# Patient Record
Sex: Female | Born: 1975 | Race: Black or African American | Hispanic: No | Marital: Married | Smoking: Never smoker
Health system: Southern US, Community
[De-identification: ages and names within clinical notes are randomized; demographics above are authoritative.]

## PROBLEM LIST (undated history)

## (undated) DIAGNOSIS — B019 Varicella without complication: Secondary | ICD-10-CM

## (undated) DIAGNOSIS — D649 Anemia, unspecified: Secondary | ICD-10-CM

## (undated) DIAGNOSIS — N939 Abnormal uterine and vaginal bleeding, unspecified: Secondary | ICD-10-CM

## (undated) DIAGNOSIS — D759 Disease of blood and blood-forming organs, unspecified: Secondary | ICD-10-CM

## (undated) DIAGNOSIS — K219 Gastro-esophageal reflux disease without esophagitis: Secondary | ICD-10-CM

## (undated) DIAGNOSIS — D563 Thalassemia minor: Secondary | ICD-10-CM

## (undated) DIAGNOSIS — R51 Headache: Secondary | ICD-10-CM

## (undated) DIAGNOSIS — D259 Leiomyoma of uterus, unspecified: Secondary | ICD-10-CM

## (undated) DIAGNOSIS — T7840XA Allergy, unspecified, initial encounter: Secondary | ICD-10-CM

## (undated) DIAGNOSIS — R519 Headache, unspecified: Secondary | ICD-10-CM

## (undated) HISTORY — DX: Gastro-esophageal reflux disease without esophagitis: K21.9

## (undated) HISTORY — PX: WISDOM TOOTH EXTRACTION: SHX21

## (undated) HISTORY — DX: Varicella without complication: B01.9

## (undated) HISTORY — DX: Thalassemia minor: D56.3

## (undated) HISTORY — DX: Allergy, unspecified, initial encounter: T78.40XA

## (undated) HISTORY — DX: Leiomyoma of uterus, unspecified: D25.9

---

## 2014-07-09 ENCOUNTER — Ambulatory Visit: Payer: Self-pay | Admitting: Medical

## 2014-07-09 LAB — RAPID STREP-A WITH REFLX: Micro Text Report: POSITIVE

## 2015-02-13 ENCOUNTER — Ambulatory Visit (INDEPENDENT_AMBULATORY_CARE_PROVIDER_SITE_OTHER): Payer: 59 | Admitting: Internal Medicine

## 2015-02-13 ENCOUNTER — Encounter: Payer: Self-pay | Admitting: Internal Medicine

## 2015-02-13 VITALS — BP 116/72 | HR 95 | Temp 98.3°F | Ht 65.5 in | Wt 236.0 lb

## 2015-02-13 DIAGNOSIS — E669 Obesity, unspecified: Secondary | ICD-10-CM | POA: Insufficient documentation

## 2015-02-13 DIAGNOSIS — K219 Gastro-esophageal reflux disease without esophagitis: Secondary | ICD-10-CM

## 2015-02-13 DIAGNOSIS — N926 Irregular menstruation, unspecified: Secondary | ICD-10-CM

## 2015-02-13 DIAGNOSIS — Z0000 Encounter for general adult medical examination without abnormal findings: Secondary | ICD-10-CM

## 2015-02-13 DIAGNOSIS — R635 Abnormal weight gain: Secondary | ICD-10-CM

## 2015-02-13 LAB — COMPREHENSIVE METABOLIC PANEL
ALBUMIN: 3.9 g/dL (ref 3.5–5.2)
ALK PHOS: 73 U/L (ref 39–117)
ALT: 8 U/L (ref 0–35)
AST: 14 U/L (ref 0–37)
BILIRUBIN TOTAL: 0.2 mg/dL (ref 0.2–1.2)
BUN: 12 mg/dL (ref 6–23)
CO2: 26 meq/L (ref 19–32)
Calcium: 8.8 mg/dL (ref 8.4–10.5)
Chloride: 106 mEq/L (ref 96–112)
Creatinine, Ser: 0.74 mg/dL (ref 0.40–1.20)
GFR: 111.43 mL/min (ref >60.00)
GLUCOSE: 95 mg/dL (ref 70–99)
Potassium: 3.8 mEq/L (ref 3.5–5.1)
Sodium: 135 mEq/L (ref 135–145)
Total Protein: 7.5 g/dL (ref 6.0–8.3)

## 2015-02-13 LAB — CBC
HCT: 27.4 % — ABNORMAL LOW (ref 36.0–46.0)
MCHC: 30.6 g/dL (ref 30.0–36.0)
MCV: 60 fl — ABNORMAL LOW (ref 78.0–100.0)
Platelets: 410 10*3/uL — ABNORMAL HIGH (ref 150.0–400.0)
RBC: 4.57 Mil/uL (ref 3.87–5.11)
RDW: 19.5 % — AB (ref 11.5–15.5)
WBC: 6.6 10*3/uL (ref 4.0–10.5)

## 2015-02-13 LAB — LIPID PANEL
CHOLESTEROL: 145 mg/dL (ref 0–200)
HDL: 57.8 mg/dL (ref >39.00)
LDL Cholesterol: 77 mg/dL (ref 0–99)
NonHDL: 87.2
Total CHOL/HDL Ratio: 3
Triglycerides: 51 mg/dL (ref 0.0–149.0)
VLDL: 10.2 mg/dL (ref 0.0–40.0)

## 2015-02-13 LAB — LUTEINIZING HORMONE: LH: 7.23 m[IU]/mL

## 2015-02-13 LAB — FOLLICLE STIMULATING HORMONE: FSH: 8.1 m[IU]/mL

## 2015-02-13 LAB — HEMOGLOBIN A1C: HEMOGLOBIN A1C: 5.9 % (ref 4.6–6.5)

## 2015-02-13 LAB — TSH: TSH: 1.89 u[IU]/mL (ref 0.35–4.50)

## 2015-02-13 MED ORDER — OMEPRAZOLE 20 MG PO CPDR: 20.0000 mg | DELAYED_RELEASE_CAPSULE | Freq: Every day | ORAL | Status: DC

## 2015-02-13 NOTE — Patient Instructions (Signed)

## 2015-02-13 NOTE — Assessment & Plan Note (Signed)
?   If weight gain is due to perimenopause, thyroid abnormality or lack of activity/poor diet Will check TSH, LH/FSH and A1C today-will call her with the lab results Encouraged her to work on diet and exercise for now

## 2015-02-13 NOTE — Assessment & Plan Note (Signed)
?   If this is perimenopause or d/t fibroids Will check LH/FSH today If labs normal, she should have ultrasound to assess fibroids Referral placed to gyn for further evaluation and management

## 2015-02-13 NOTE — Progress Notes (Signed)
HPI  Pt presents to the clinic today to establish care and for management of the conditions listed below: She also request to have her physical exam today.  Flu: 09/2014 Tetanus: > 10 years ago Pap Smear: 2011- normal Mammogram: never Vision Screening: yearly Dentist: yearly  GERD: Occuring > 3 x per week. Acidic foods seem to make it worse. She has tried Zantac OTC without much relief. She has never been prescribed medication for GERD in the past. She has never had an EGD.  She also reports that she has been having irrgular menses. This has been going on for the last 6 months. She reports her periods occur every 2 weeks and last for 9 days at a time. She denies heavy cramping or PMS symptoms. She has noticed some weight gain. She does have a history of fibroids. She has not been sexually active in the last 6 months because her husband suffers from ED. She would like further evaluation today.  Past Medical History  Diagnosis Date  . Chicken pox   . GERD (gastroesophageal reflux disease)     Current Outpatient Prescriptions  Medication Sig Dispense Refill  . calcium carbonate (TUMS - DOSED IN MG ELEMENTAL CALCIUM) 500 MG chewable tablet Chew 1 tablet by mouth daily.    . cetirizine (ZYRTEC) 10 MG tablet Take 10 mg by mouth daily.    Marland Kitchen omeprazole (PRILOSEC) 20 MG capsule Take 1 capsule (20 mg total) by mouth daily. 30 capsule 5   No current facility-administered medications for this visit.    No Known Allergies  Family History  Problem Relation Age of Onset  . Arthritis Mother   . Arthritis Maternal Aunt   . Cancer Maternal Aunt     Breast  . Arthritis Maternal Uncle   . Arthritis Maternal Grandmother   . Stroke Maternal Grandmother   . Cancer Paternal Grandfather     Prostate    History   Social History  . Marital Status: Married    Spouse Name: N/A  . Number of Children: N/A  . Years of Education: N/A   Occupational History  . Not on file.   Social History Main  Topics  . Smoking status: Never Smoker   . Smokeless tobacco: Never Used  . Alcohol Use: 0.0 oz/week    0 Standard drinks or equivalent per week     Comment: occasional  . Drug Use: Not on file  . Sexual Activity: Not on file   Other Topics Concern  . Not on file   Social History Narrative  . No narrative on file    ROS:  Constitutional: Pt reports weight gain. Denies fever, malaise, fatigue, headache.  HEENT: Denies eye pain, eye redness, ear pain, ringing in the ears, wax buildup, runny nose, nasal congestion, bloody nose, or sore throat. Respiratory: Denies difficulty breathing, shortness of breath, cough or sputum production.   Cardiovascular: Denies chest pain, chest tightness, palpitations or swelling in the hands or feet.  Gastrointestinal: Denies abdominal pain, bloating, constipation, diarrhea or blood in the stool.  GU: Pt reports irregular menses. Denies frequency, urgency, pain with urination, blood in urine, odor or discharge. Musculoskeletal: Denies decrease in range of motion, difficulty with gait, muscle pain or joint pain and swelling.  Skin: Denies redness, rashes, lesions or ulcercations.  Neurological: Denies dizziness, difficulty with memory, difficulty with speech or problems with balance and coordination.  Psych: Pt denies anxiety, depression, SI/HI.   No other specific complaints in a complete review of  systems (except as listed in HPI above).  PE:  BP 116/72 mmHg  Pulse 95  Temp(Src) 98.3 F (36.8 C) (Oral)  Ht 5' 5.5" (1.664 m)  Wt 236 lb (107.049 kg)  BMI 38.66 kg/m2  SpO2 98%  LMP 02/01/2015 Wt Readings from Last 3 Encounters:  02/13/15 236 lb (107.049 kg)    General: Appears her stated age, obese in NAD. HEENT: Head: normal shape and size; Eyes: sclera white, no icterus, conjunctiva pink, PERRLA and EOMs intact; Ears: Tm's gray and intact, normal light reflex, cerumen impaction on the left; Nose: mucosa pink and moist, septum midline;  Throat/Mouth: Teeth present, mucosa pink and moist, no lesions or ulcerations noted.  Neck: Neck supple, trachea midline. No masses, lumps or thyromegaly present.  Cardiovascular: Normal rate and rhythm. S1,S2 noted.  No murmur, rubs or gallops noted. No JVD. Trace BLE edema. No carotid bruits noted. Pulmonary/Chest: Normal effort and positive vesicular breath sounds. No respiratory distress. No wheezes, rales or ronchi noted.  Abdomen: Soft and nontender. Normal bowel sounds, no bruits noted. No distention or masses noted. Liver, spleen and kidneys non palpable. Musculoskeletal: Normal range of motion. Strength 5/5 BUE/BLE. No difficulty with gait.  Neurological: Alert and oriented. Cranial nerves II-XII grossly intact. Coordination normal.  Psychiatric: Mood and affect normal. Behavior is normal. Judgment and thought content normal.    Assessment and Plan:  Preventative Health Maintenance:  Will refer to gyn for pap smear, given her irregular menses and fibroids Mammogram ordered today- she will call [facility] to schedule Will check CBC, CMET, Lipid and A1C today  RTC in 1 year or sooner if needed

## 2015-02-13 NOTE — Progress Notes (Signed)
Pre visit review using our clinic review tool, if applicable. No additional management support is needed unless otherwise documented below in the visit note. 

## 2015-02-13 NOTE — Assessment & Plan Note (Signed)
Will start with RX for Prilosec 20 mg daily Discussed benefits of weight loss Advised her to avoid acidic foods, or foods she knows triggers her reflux Will check CBC and CMET today

## 2015-02-15 ENCOUNTER — Encounter: Payer: Self-pay | Admitting: Internal Medicine

## 2015-02-20 ENCOUNTER — Encounter: Payer: Self-pay | Admitting: Internal Medicine

## 2015-02-20 ENCOUNTER — Ambulatory Visit: Payer: Self-pay | Admitting: Internal Medicine

## 2015-02-22 ENCOUNTER — Ambulatory Visit: Payer: Self-pay | Admitting: Internal Medicine

## 2015-02-22 ENCOUNTER — Encounter: Payer: Self-pay | Admitting: Internal Medicine

## 2015-07-25 ENCOUNTER — Encounter (HOSPITAL_COMMUNITY): Payer: Self-pay

## 2015-07-25 ENCOUNTER — Encounter (HOSPITAL_COMMUNITY)
Admission: RE | Admit: 2015-07-25 | Discharge: 2015-07-25 | Disposition: A | Discharge status: Home / Self Care | Payer: 59 | Source: Ambulatory Visit | Attending: Obstetrics & Gynecology | Admitting: Obstetrics & Gynecology

## 2015-07-25 DIAGNOSIS — D649 Anemia, unspecified: Secondary | ICD-10-CM | POA: Insufficient documentation

## 2015-07-25 DIAGNOSIS — Z01818 Encounter for other preprocedural examination: Secondary | ICD-10-CM | POA: Diagnosis present

## 2015-07-25 DIAGNOSIS — D259 Leiomyoma of uterus, unspecified: Secondary | ICD-10-CM | POA: Insufficient documentation

## 2015-07-25 DIAGNOSIS — N920 Excessive and frequent menstruation with regular cycle: Secondary | ICD-10-CM | POA: Diagnosis not present

## 2015-07-25 HISTORY — DX: Disease of blood and blood-forming organs, unspecified: D75.9

## 2015-07-25 HISTORY — DX: Headache: R51

## 2015-07-25 HISTORY — DX: Headache, unspecified: R51.9

## 2015-07-25 HISTORY — DX: Anemia, unspecified: D64.9

## 2015-07-25 HISTORY — DX: Abnormal uterine and vaginal bleeding, unspecified: N93.9

## 2015-07-25 LAB — COMPREHENSIVE METABOLIC PANEL
ALT: 10 U/L — ABNORMAL LOW (ref 14–54)
ANION GAP: 7 (ref 5–15)
AST: 16 U/L (ref 15–41)
Albumin: 3.6 g/dL (ref 3.5–5.0)
Alkaline Phosphatase: 50 U/L (ref 38–126)
BUN: 11 mg/dL (ref 6–20)
CALCIUM: 9 mg/dL (ref 8.9–10.3)
CO2: 24 mmol/L (ref 22–32)
Chloride: 106 mmol/L (ref 101–111)
Creatinine, Ser: 0.76 mg/dL (ref 0.44–1.00)
GFR calc Af Amer: >60 mL/min (ref >60)
GFR calc non Af Amer: >60 mL/min (ref >60)
Glucose, Bld: 112 mg/dL — ABNORMAL HIGH (ref 65–99)
POTASSIUM: 3.9 mmol/L (ref 3.5–5.1)
Sodium: 137 mmol/L (ref 135–145)
Total Bilirubin: 0.6 mg/dL (ref 0.3–1.2)
Total Protein: 7.4 g/dL (ref 6.5–8.1)

## 2015-07-25 LAB — TYPE AND SCREEN
ABO/RH(D): A POS
Antibody Screen: NEGATIVE

## 2015-07-25 LAB — CBC
HEMATOCRIT: 33.7 % — AB (ref 36.0–46.0)
Hemoglobin: 10.9 g/dL — ABNORMAL LOW (ref 12.0–15.0)
MCH: 22 pg — AB (ref 26.0–34.0)
MCHC: 32.3 g/dL (ref 30.0–36.0)
MCV: 68.1 fL — ABNORMAL LOW (ref 78.0–100.0)
Platelets: 303 10*3/uL (ref 150–400)
RBC: 4.95 MIL/uL (ref 3.87–5.11)
RDW: 18 % — AB (ref 11.5–15.5)
WBC: 7.9 10*3/uL (ref 4.0–10.5)

## 2015-07-25 LAB — ABO/RH: ABO/RH(D): A POS

## 2015-07-25 NOTE — Patient Instructions (Signed)
Your procedure is scheduled on:07/30/15  Enter through the Main Entrance at :6am Pick up desk phone and dial [phone] and inform us of your arrival.  Please call [phone] if you have any problems the morning of surgery.  Remember: Do not eat food or drink liquids, including water, after midnight:Sunday   You may brush your teeth the morning of surgery.  Take these meds the morning of surgery with a sip of water:Prilosec, BCP  DO NOT wear jewelry, eye make-up, lipstick,body lotion, or dark fingernail polish.  (Polished toes are ok) You may wear deodorant.  If you are to be admitted after surgery, leave suitcase in car until your room has been assigned. Patients discharged on the day of surgery will not be allowed to drive home. Wear loose fitting, comfortable clothes for your ride home.

## 2015-07-28 NOTE — H&P (Signed)
Latasha Klein is an 39 y.o. female with known history of uterine leiomyomata; heavy menstrual bleeding to anemia and dysmenorrhea.  The patient has tried OCPs but with minimal improvement.  She has completed childbearing.  Patient declines continued hormonal management as well as [place].  Last ultrasound showed 400cc uterus with multiple fibroids; the largest anterior and measuring 4.5 cm.  No prior abdominal surgeries.  Pertinent Gynecological History: Menses: regular every month without intermenstrual spotting Bleeding: dysfunctional uterine bleeding Contraception: OCP (estrogen/progesterone) DES exposure: unknown Blood transfusions: none Sexually transmitted diseases: no past history Previous GYN Procedures: none  Last mammogram: normal Date: 2016 Last pap: normal Date: 2016 OB History: G1, P1   Menstrual History: Menarche age: n/a No LMP recorded.    Past Medical History  Diagnosis Date  . Chicken pox   . GERD (gastroesophageal reflux disease)   . Abnormal uterine bleeding (AUB)   . Headache     occasional migraines  . Blood dyscrasia     beta thalacemia  . Anemia     Past Surgical History  Procedure Laterality Date  . Wisdom tooth extraction      Family History  Problem Relation Age of Onset  . Arthritis Mother   . Arthritis Maternal Aunt   . Cancer Maternal Aunt     Breast  . Arthritis Maternal Uncle   . Arthritis Maternal Grandmother   . Stroke Maternal Grandmother   . Cancer Paternal Grandfather     Prostate    Social History:  reports that she has never smoked. She has never used smokeless tobacco. She reports that she drinks alcohol. She reports that she does not use illicit drugs.  Allergies: No Known Allergies  No prescriptions prior to admission    ROS  There were no vitals taken for this visit. Physical Exam  Constitutional: She is oriented to person, place, and time. She appears well-developed and well-nourished.  GI: Soft. There is no  rebound and no guarding.  Neurological: She is alert and oriented to person, place, and time.  Skin: Skin is warm and dry.  Psychiatric: She has a normal mood and affect. Her behavior is normal.    No results found for this or any previous visit (from the past 24 hour(s)).  No results found.  Assessment/Plan: 39 yo G1P1 with symptomatic uterine leiomyomata -Patient is counseled about risk of bleeding, infection, scarring and damage to surrounding structures.  Patient is counseled re: possible need to convert to abdominal hysterectomy secondary to size and number of fibroids.  All questions were answered and we will proceed with LAVH, bilateral salpingectomy, possible TAH.  Salinda Snedeker, [place] 07/28/2015, 2:47 PM

## 2015-07-29 ENCOUNTER — Encounter (HOSPITAL_COMMUNITY): Payer: Self-pay | Admitting: Anesthesiology

## 2015-07-29 NOTE — Anesthesia Preprocedure Evaluation (Addendum)
Anesthesia Evaluation  Patient identified by MRN, date of birth, ID band Patient awake    Reviewed: Allergy & Precautions, NPO status , Patient's Chart, lab work & pertinent test results  Airway Mallampati: II  TM Distance: >3 FB Neck ROM: Full    Dental no notable dental hx. (+) Chipped,    Pulmonary neg pulmonary ROS,  breath sounds clear to auscultation  Pulmonary exam normal       Cardiovascular Normal cardiovascular examRhythm:Regular Rate:Normal     Neuro/Psych  Headaches, negative psych ROS   GI/Hepatic Neg liver ROS, GERD-  Medicated and Controlled,  Endo/Other  Obesity  Renal/GU negative Renal ROS  negative genitourinary   Musculoskeletal   Abdominal   Peds  Hematology  (+) Blood dyscrasia, anemia , Beta thalassemia   Anesthesia Other Findings   Reproductive/Obstetrics                            Anesthesia Physical Anesthesia Plan  ASA: II  Anesthesia Plan: General   Post-op Pain Management:    Induction: Intravenous and Cricoid pressure planned  Airway Management Planned: Oral ETT  Additional Equipment:   Intra-op Plan:   Post-operative Plan:   Informed Consent: I have reviewed the patients History and Physical, chart, labs and discussed the procedure including the risks, benefits and alternatives for the proposed anesthesia with the patient or authorized representative who has indicated his/her understanding and acceptance.   Dental advisory given  Plan Discussed with: Anesthesiologist, CRNA and Surgeon  Anesthesia Plan Comments:         Anesthesia Quick Evaluation

## 2015-07-30 ENCOUNTER — Ambulatory Visit (HOSPITAL_COMMUNITY): Payer: 59 | Admitting: Anesthesiology

## 2015-07-30 ENCOUNTER — Inpatient Hospital Stay (HOSPITAL_COMMUNITY)
Admission: AD | Admit: 2015-07-30 | Discharge: 2015-08-01 | DRG: 743 | Disposition: A | Discharge status: Home / Self Care | Payer: 59 | Source: Ambulatory Visit | Attending: Obstetrics & Gynecology | Admitting: Obstetrics & Gynecology

## 2015-07-30 ENCOUNTER — Encounter (HOSPITAL_COMMUNITY): Payer: Self-pay | Admitting: *Deleted

## 2015-07-30 ENCOUNTER — Encounter (HOSPITAL_COMMUNITY)
Admission: AD | Disposition: A | Discharge status: Home / Self Care | Payer: Self-pay | Source: Ambulatory Visit | Attending: Obstetrics & Gynecology

## 2015-07-30 DIAGNOSIS — D259 Leiomyoma of uterus, unspecified: Secondary | ICD-10-CM | POA: Diagnosis present

## 2015-07-30 DIAGNOSIS — D649 Anemia, unspecified: Secondary | ICD-10-CM | POA: Diagnosis present

## 2015-07-30 DIAGNOSIS — N946 Dysmenorrhea, unspecified: Secondary | ICD-10-CM | POA: Diagnosis present

## 2015-07-30 DIAGNOSIS — Z90710 Acquired absence of both cervix and uterus: Secondary | ICD-10-CM | POA: Diagnosis present

## 2015-07-30 DIAGNOSIS — K219 Gastro-esophageal reflux disease without esophagitis: Secondary | ICD-10-CM | POA: Diagnosis present

## 2015-07-30 HISTORY — PX: BILATERAL SALPINGECTOMY: SHX5743

## 2015-07-30 HISTORY — PX: ABDOMINAL HYSTERECTOMY: SHX81

## 2015-07-30 HISTORY — PX: LAPAROSCOPIC ASSISTED VAGINAL HYSTERECTOMY: SHX5398

## 2015-07-30 LAB — PREGNANCY, URINE: Preg Test, Ur: NEGATIVE

## 2015-07-30 SURGERY — HYSTERECTOMY, VAGINAL, LAPAROSCOPY-ASSISTED
Anesthesia: General | Site: Abdomen

## 2015-07-30 MED ORDER — MIDAZOLAM HCL 2 MG/2ML IJ SOLN
INTRAMUSCULAR | Status: AC
  Filled 2015-07-30: qty 4

## 2015-07-30 MED ORDER — CEFAZOLIN SODIUM-DEXTROSE 2-3 GM-% IV SOLR: 2.0000 g | INTRAVENOUS | Status: DC

## 2015-07-30 MED ORDER — LIDOCAINE HCL (CARDIAC) 20 MG/ML IV SOLN
INTRAVENOUS | Status: DC | PRN
  Administered 2015-07-30 (×2): 50 mg via INTRAVENOUS

## 2015-07-30 MED ORDER — HYDROMORPHONE HCL 1 MG/ML IJ SOLN
INTRAMUSCULAR | Status: AC
  Filled 2015-07-30: qty 1

## 2015-07-30 MED ORDER — FENTANYL CITRATE (PF) 100 MCG/2ML IJ SOLN
INTRAMUSCULAR | Status: DC | PRN
  Administered 2015-07-30 (×3): 100 ug via INTRAVENOUS
  Administered 2015-07-30: 50 ug via INTRAVENOUS

## 2015-07-30 MED ORDER — PROPOFOL 10 MG/ML IV BOLUS
INTRAVENOUS | Status: AC
  Filled 2015-07-30: qty 20

## 2015-07-30 MED ORDER — DOCUSATE SODIUM 100 MG PO CAPS
100.0000 mg | ORAL_CAPSULE | Freq: Two times a day (BID) | ORAL | Status: DC
  Administered 2015-07-30 – 2015-07-31 (×3): 100 mg via ORAL
  Filled 2015-07-30 (×7): qty 1

## 2015-07-30 MED ORDER — LACTATED RINGERS IV SOLN
INTRAVENOUS | Status: DC
  Administered 2015-07-30 (×2): via INTRAVENOUS

## 2015-07-30 MED ORDER — KETOROLAC TROMETHAMINE 30 MG/ML IJ SOLN
30.0000 mg | Freq: Four times a day (QID) | INTRAMUSCULAR | Status: DC
  Administered 2015-07-30 – 2015-08-01 (×6): 30 mg via INTRAVENOUS
  Filled 2015-07-30 (×5): qty 1

## 2015-07-30 MED ORDER — DEXTROSE IN LACTATED RINGERS 5 % IV SOLN
INTRAVENOUS | Status: DC
  Administered 2015-07-30 – 2015-07-31 (×3): via INTRAVENOUS

## 2015-07-30 MED ORDER — ONDANSETRON HCL 4 MG PO TABS: 4.0000 mg | ORAL_TABLET | Freq: Four times a day (QID) | ORAL | Status: DC | PRN

## 2015-07-30 MED ORDER — NEOSTIGMINE METHYLSULFATE 10 MG/10ML IV SOLN
INTRAVENOUS | Status: AC
  Filled 2015-07-30: qty 1

## 2015-07-30 MED ORDER — ALUM & MAG HYDROXIDE-SIMETH 200-200-20 MG/5ML PO SUSP
30.0000 mL | ORAL | Status: DC | PRN
  Filled 2015-07-30: qty 30

## 2015-07-30 MED ORDER — HYDROMORPHONE HCL 1 MG/ML IJ SOLN
0.2000 mg | INTRAMUSCULAR | Status: DC | PRN
  Administered 2015-07-30: 0.5 mg via INTRAVENOUS
  Administered 2015-07-30: 0.6 mg via INTRAVENOUS
  Administered 2015-07-30 (×2): 0.5 mg via INTRAVENOUS
  Filled 2015-07-30 (×3): qty 1

## 2015-07-30 MED ORDER — SIMETHICONE 80 MG PO CHEW
80.0000 mg | CHEWABLE_TABLET | Freq: Four times a day (QID) | ORAL | Status: DC | PRN
  Filled 2015-07-30: qty 1

## 2015-07-30 MED ORDER — OXYCODONE-ACETAMINOPHEN 5-325 MG PO TABS
1.0000 | ORAL_TABLET | ORAL | Status: DC | PRN
  Administered 2015-07-31 (×3): 2 via ORAL
  Filled 2015-07-30 (×3): qty 2
  Filled 2015-07-30: qty 1

## 2015-07-30 MED ORDER — ONDANSETRON HCL 4 MG/2ML IJ SOLN
INTRAMUSCULAR | Status: AC
  Filled 2015-07-30: qty 2

## 2015-07-30 MED ORDER — ROCURONIUM BROMIDE 100 MG/10ML IV SOLN
INTRAVENOUS | Status: AC
  Filled 2015-07-30: qty 1

## 2015-07-30 MED ORDER — ONDANSETRON HCL 4 MG/2ML IJ SOLN
INTRAMUSCULAR | Status: DC | PRN
  Administered 2015-07-30: 4 mg via INTRAVENOUS

## 2015-07-30 MED ORDER — HYDROMORPHONE HCL 1 MG/ML IJ SOLN
INTRAMUSCULAR | Status: DC | PRN
Start: 2015-07-30 — End: 2015-07-30
  Administered 2015-07-30 (×2): 0.5 mg via INTRAVENOUS

## 2015-07-30 MED ORDER — BUPIVACAINE HCL (PF) 0.25 % IJ SOLN
INTRAMUSCULAR | Status: DC | PRN
  Administered 2015-07-30: 2 mL

## 2015-07-30 MED ORDER — HYDROMORPHONE HCL 1 MG/ML IJ SOLN
0.2500 mg | INTRAMUSCULAR | Status: DC | PRN
  Administered 2015-07-30 (×3): 0.5 mg via INTRAVENOUS

## 2015-07-30 MED ORDER — MENTHOL 3 MG MT LOZG
1.0000 | LOZENGE | OROMUCOSAL | Status: DC | PRN
  Filled 2015-07-30: qty 9

## 2015-07-30 MED ORDER — KETOROLAC TROMETHAMINE 30 MG/ML IJ SOLN
INTRAMUSCULAR | Status: AC
  Filled 2015-07-30: qty 1

## 2015-07-30 MED ORDER — FENTANYL CITRATE (PF) 100 MCG/2ML IJ SOLN
INTRAMUSCULAR | Status: AC
  Filled 2015-07-30: qty 4

## 2015-07-30 MED ORDER — CEFAZOLIN SODIUM-DEXTROSE 2-3 GM-% IV SOLR
INTRAVENOUS | Status: AC
  Filled 2015-07-30: qty 50

## 2015-07-30 MED ORDER — ROCURONIUM BROMIDE 100 MG/10ML IV SOLN
INTRAVENOUS | Status: DC | PRN
  Administered 2015-07-30: 10 mg via INTRAVENOUS
  Administered 2015-07-30: 45 mg via INTRAVENOUS
  Administered 2015-07-30: 5 mg via INTRAVENOUS
  Administered 2015-07-30 (×2): 10 mg via INTRAVENOUS

## 2015-07-30 MED ORDER — SCOPOLAMINE 1 MG/3DAYS TD PT72
MEDICATED_PATCH | TRANSDERMAL | Status: AC
  Administered 2015-07-30: 1.5 mg via TRANSDERMAL
  Filled 2015-07-30: qty 1

## 2015-07-30 MED ORDER — MIDAZOLAM HCL 5 MG/5ML IJ SOLN
INTRAMUSCULAR | Status: DC | PRN
  Administered 2015-07-30: 2 mg via INTRAVENOUS

## 2015-07-30 MED ORDER — GLYCOPYRROLATE 0.2 MG/ML IJ SOLN
INTRAMUSCULAR | Status: DC | PRN
  Administered 2015-07-30: .6 mg via INTRAVENOUS

## 2015-07-30 MED ORDER — ONDANSETRON HCL 4 MG/2ML IJ SOLN
4.0000 mg | Freq: Four times a day (QID) | INTRAMUSCULAR | Status: DC | PRN
  Administered 2015-07-30: 4 mg via INTRAVENOUS
  Filled 2015-07-30: qty 2

## 2015-07-30 MED ORDER — FENTANYL CITRATE (PF) 250 MCG/5ML IJ SOLN
INTRAMUSCULAR | Status: AC
  Filled 2015-07-30: qty 25

## 2015-07-30 MED ORDER — LACTATED RINGERS IV SOLN
INTRAVENOUS | Status: DC
  Administered 2015-07-30: 07:00:00 via INTRAVENOUS

## 2015-07-30 MED ORDER — HYDROMORPHONE HCL 1 MG/ML IJ SOLN
INTRAMUSCULAR | Status: AC
  Administered 2015-07-30: 0.5 mg via INTRAVENOUS
  Filled 2015-07-30: qty 1

## 2015-07-30 MED ORDER — SCOPOLAMINE 1 MG/3DAYS TD PT72
1.0000 | MEDICATED_PATCH | Freq: Once | TRANSDERMAL | Status: DC
  Administered 2015-07-30: 1.5 mg via TRANSDERMAL

## 2015-07-30 MED ORDER — METOCLOPRAMIDE HCL 5 MG/ML IJ SOLN: 10.0000 mg | Freq: Once | INTRAMUSCULAR | Status: DC | PRN

## 2015-07-30 MED ORDER — MEPERIDINE HCL 25 MG/ML IJ SOLN: 6.2500 mg | INTRAMUSCULAR | Status: DC | PRN

## 2015-07-30 MED ORDER — PROPOFOL 10 MG/ML IV BOLUS
INTRAVENOUS | Status: DC | PRN
  Administered 2015-07-30: 200 mg via INTRAVENOUS

## 2015-07-30 MED ORDER — NEOSTIGMINE METHYLSULFATE 10 MG/10ML IV SOLN
INTRAVENOUS | Status: DC | PRN
  Administered 2015-07-30: 3.5 mg via INTRAVENOUS

## 2015-07-30 MED ORDER — KETOROLAC TROMETHAMINE 30 MG/ML IJ SOLN: 30.0000 mg | Freq: Four times a day (QID) | INTRAMUSCULAR | Status: DC

## 2015-07-30 MED ORDER — LIDOCAINE HCL (CARDIAC) 20 MG/ML IV SOLN
INTRAVENOUS | Status: AC
  Filled 2015-07-30: qty 5

## 2015-07-30 MED ORDER — BUPIVACAINE HCL (PF) 0.25 % IJ SOLN
INTRAMUSCULAR | Status: AC
  Filled 2015-07-30: qty 30

## 2015-07-30 SURGICAL SUPPLY — 44 items
CABLE HIGH FREQUENCY MONO STRZ (ELECTRODE) IMPLANT
CANISTER SUCT 3000ML (MISCELLANEOUS) ×3 IMPLANT
CLOTH BEACON ORANGE TIMEOUT ST (SAFETY) ×3 IMPLANT
COVER BACK TABLE 60X90IN (DRAPES) ×3 IMPLANT
DECANTER SPIKE VIAL GLASS SM (MISCELLANEOUS) ×3 IMPLANT
DRSG COVADERM PLUS 2X2 (GAUZE/BANDAGES/DRESSINGS) IMPLANT
DRSG OPSITE POSTOP 3X4 (GAUZE/BANDAGES/DRESSINGS) ×3 IMPLANT
DRSG OPSITE POSTOP 4X10 (GAUZE/BANDAGES/DRESSINGS) ×3 IMPLANT
DURAPREP 26ML APPLICATOR (WOUND CARE) ×3 IMPLANT
ELECT LIGASURE LONG (ELECTRODE) IMPLANT
ELECT REM PT RETURN 9FT ADLT (ELECTROSURGICAL) ×3
ELECTRODE REM PT RTRN 9FT ADLT (ELECTROSURGICAL) ×2 IMPLANT
FILTER SMOKE EVAC LAPAROSHD (FILTER) ×3 IMPLANT
GLOVE BIO SURGEON STRL SZ 6 (GLOVE) ×6 IMPLANT
GLOVE BIOGEL PI IND STRL 6 (GLOVE) ×4 IMPLANT
GLOVE BIOGEL PI IND STRL 7.0 (GLOVE) ×4 IMPLANT
GLOVE BIOGEL PI INDICATOR 6 (GLOVE) ×2
GLOVE BIOGEL PI INDICATOR 7.0 (GLOVE) ×2
LIQUID BAND (GAUZE/BANDAGES/DRESSINGS) ×3 IMPLANT
NEEDLE INSUFFLATION 120MM (ENDOMECHANICALS) ×3 IMPLANT
NS IRRIG 1000ML POUR BTL (IV SOLUTION) ×6 IMPLANT
PACK LAVH (CUSTOM PROCEDURE TRAY) ×3 IMPLANT
PACK ROBOTIC GOWN (GOWN DISPOSABLE) ×3 IMPLANT
PAD POSITIONING PINK XL (MISCELLANEOUS) ×3 IMPLANT
SEALER TISSUE G2 CVD JAW 45CM (ENDOMECHANICALS) IMPLANT
SLEEVE XCEL OPT CAN 5 100 (ENDOMECHANICALS) ×3 IMPLANT
SPONGE LAP 18X18 X RAY DECT (DISPOSABLE) ×9 IMPLANT
SUT MNCRL 0 MO-4 VIOLET 18 CR (SUTURE) ×6 IMPLANT
SUT MNCRL AB 3-0 PS2 27 (SUTURE) ×3 IMPLANT
SUT MON AB 2-0 CT1 36 (SUTURE) ×3 IMPLANT
SUT MON AB 2-0 SH 27 (SUTURE) ×1
SUT MON AB 2-0 SH27 (SUTURE) ×2 IMPLANT
SUT MONOCRYL 0 MO 4 18  CR/8 (SUTURE) ×3
SUT PLAIN 2 0 XLH (SUTURE) ×3 IMPLANT
SUT VIC AB 0 CT1 27 (SUTURE) ×1
SUT VIC AB 0 CT1 27XBRD ANBCTR (SUTURE) ×2 IMPLANT
SUT VIC AB 4-0 KS 27 (SUTURE) ×3 IMPLANT
SUT VICRYL 0 TIES 12 18 (SUTURE) ×3 IMPLANT
SUT VICRYL 0 UR6 27IN ABS (SUTURE) ×3 IMPLANT
TOWEL OR 17X24 6PK STRL BLUE (TOWEL DISPOSABLE) ×6 IMPLANT
TRAY FOLEY CATH SILVER 14FR (SET/KITS/TRAYS/PACK) ×3 IMPLANT
TROCAR XCEL NON-BLD 11X100MML (ENDOMECHANICALS) ×3 IMPLANT
TROCAR XCEL NON-BLD 5MMX100MML (ENDOMECHANICALS) ×3 IMPLANT
WARMER LAPAROSCOPE (MISCELLANEOUS) ×3 IMPLANT

## 2015-07-30 NOTE — Transfer of Care (Signed)
Immediate Anesthesia Transfer of Care Note  Patient: [name]  Procedure(s) Performed: Procedure(s): ATTEMPTED LAPAROSCOPIC ASSISTED VAGINAL HYSTERECTOMY (N/A) HYSTERECTOMY ABDOMINAL (N/A) BILATERAL SALPINGECTOMY (Bilateral)  Patient Location: PACU  Anesthesia Type:General  Level of Consciousness: awake, alert  and oriented  Airway & Oxygen Therapy: Patient Spontanous Breathing and Patient connected to nasal cannula oxygen  Post-op Assessment: Report given to RN and Post -op Vital signs reviewed and stable  Post vital signs: Reviewed and stable  Last Vitals:  Filed Vitals:   07/30/15 0615  BP: 139/87  Pulse: 98  Temp: 01.7 C    Complications: No apparent anesthesia complications

## 2015-07-30 NOTE — Anesthesia Postprocedure Evaluation (Signed)
  Anesthesia Post-op Note  Patient: Latasha Klein  Procedure(s) Performed: Procedure(s): ATTEMPTED LAPAROSCOPIC ASSISTED VAGINAL HYSTERECTOMY (N/A) HYSTERECTOMY ABDOMINAL (N/A) BILATERAL SALPINGECTOMY (Bilateral)  Patient Location: PACU  Anesthesia Type:General  Level of Consciousness: awake, alert  and oriented  Airway and Oxygen Therapy: Patient Spontanous Breathing and Patient connected to nasal cannula oxygen  Post-op Pain: mild  Post-op Assessment: Post-op Vital signs reviewed, Patient's Cardiovascular Status Stable, Respiratory Function Stable, Patent Airway, No signs of Nausea or vomiting and Pain level controlled              Post-op Vital Signs: Reviewed and stable  Last Vitals:  Filed Vitals:   07/30/15 1043  BP: 135/72  Pulse: 85  Temp: 36.9 C  Resp: 18    Complications: No apparent anesthesia complications

## 2015-07-30 NOTE — Op Note (Signed)
PROCEDURE DATE: 07/30/2015 PREOPERATIVE DIAGNOSIS: Symptomatic leiomyomata  POSTOPERATIVE DIAGNOSIS: The same  PROCEDURE: Laparoscopic Assisted Vaginal Hysterectomy converted to Totally Abdominal Hysterectomy with Bilateral Salpingectomy SURGEON: Dr. Linda Hedges  ASSISTANT: Dr. Marylynn Pearson  INDICATIONS: 39 y.o. G1P1 with uterine fibroids, heavy menstrual bleeding to anemia and dysmenorrhea desiring definitive surgical management. Risks of surgery were discussed with the patient including but not limited to: bleeding which may require transfusion or reoperation; infection which may require antibiotics; injury to bowel, bladder, ureters or other surrounding organs; need for additional procedures including laparotomy; thromboembolic phenomenon, incisional problems and other postoperative/anesthesia complications. Written informed consent was obtained.  FINDINGS: Enlarged uterus with fibroids (544g), normal adnexa bilaterally.   ANESTHESIA: General  ESTIMATED BLOOD LOSS: 300 ml  SPECIMENS: Uterus, cervix, bilateral fallopian tubes  COMPLICATIONS: None immediate  PROCEDURE IN DETAIL: The patient received intravenous antibiotics and had sequential compression devices applied to her lower extremities while in the preoperative area. She was then taken to the operating room where general anesthesia was administered and was found to be adequate. She was placed in the dorsal lithotomy position, and was prepped and draped in a sterile manner. A Foley catheter was placed. A uterine manipulator was then advanced into the uterus . After an adequate timeout was performed, attention was then turned to the patient's abdomen where a 10-mm skin incision was made in the umbilical fold. The Veress needle was carefully introduced into the peritoneal cavity through the abdominal wall. Intraperitoneal placement was confirmed by drop in intraabdominal pressure with insufflation of carbon dioxide gas. Adequate  pneumoperitoneum was obtained, and the 10/11 XL trocar and sleeve were then advanced without difficulty into the abdomen where intraabdominal placement was confirmed by the laparoscope. A survey of the patient's pelvis revealed an enlarged uterus that filled the pelvis and was immobile on manipulation.  Secondary to the inability to safely perform dissection, the decision was made to convert to open procedure. A Pfannensteil skin incision was made. This incision was taken down to the fascia using electrocautery with care given to maintain good hemostasis. The fascia was incised in the midline and the fascial incision was then extended bilaterally using electrocautery without difficulty. The fascia was then dissected off the underlying rectus muscles using blunt and sharp dissection. The rectus muscles were split bluntly in the midline and the peritoneum entered sharply without complication. This peritoneal incision was then extended superiorly and inferiorly with care given to prevent bowel or bladder injury. The uterus at this point was noted to be mobilized and was elevated from the incision using a tenaculum. The round ligaments on each side were clamped, suture ligated, and transected with electrocautery allowing entry into the broad ligament. The anterior and posterior leaves of the broad ligament were separated and the ureters were inspected to be safely away from the area of dissection bilaterally. A hole was created in the clear portion of the posterior broad ligament. Adnexae were clamped on the patient's right side. This pedicle was then clamped, cut, and doubly suture ligated with good hemostasis. This procedure was repeated in an identical fashion on the left site allowing for both adnexa to remain in place.  A bladder flap was then created across the anterior leaf of the broad ligament and the bladder was then bluntly dissected off the lower uterine segment and cervix with good hemostasis. The uterine  arteries were then skeletonized bilaterally and then clamped, cut, and ligated with care given to prevent ureteral injury. The uterosacral ligaments were then  clamped, cut, and ligated bilaterally. Finally, the cardinal ligaments were clamped, cut, and ligated bilaterally.  Acutely curved clamps were placed across the vagina just under the cervix, and the specimen was amputated and sent to pathology. The vaginal cuff was closed with a series of interrupted 0 Vicryl figure-of-eight sutures with care given to incorporate the anterior pubocervical fascia and the posterior rectovaginal fascia.  The vaginal cuff angles were noted to have good hemostasis.  The right fallopian tube was clamped, excised and the pedicle suture ligated to hemostasis.  The left fallopian tube was treated in the same fashion.  Both tubes were sent for pathology.  The pelvis was irrigated and hemostasis was reconfirmed at all pedicles and along the pelvic sidewall. The peritoneum was closed with 2-0 Monocryl, and the fascia was closed with 0 Vicryl in a running fashion. The subcutaneous layer was reapproximated with 2-0 plain gut. The skin was closed with a 4-0 Vicryl subcuticular stitch. The infraumbilical fascia was closed with 0 Vicryl figure of eight stitch.  The skin was closed with 4-0 Monocryl subcuticular stitch.  Sponge, lap, needle, and instrument counts were correct times two. The patient was taken to the recovery area awake, extubated and in stable condition.

## 2015-07-30 NOTE — Progress Notes (Signed)
No current c/o.  Pain is well controlled with meds.  No N/V.  No CP/SOB.  Tolerating clears.    VSS. AF. UOP adequate and clear  Gen: A&O x 3 Abd: soft, ND, dressings c/d x 2 Ext: no c/c/e, SCDs on  POD#0 s/p TAH -Continue pain and nausea control -Advance diet -AM labs pending -Foley out in AM  [facility], DO

## 2015-07-30 NOTE — Progress Notes (Signed)
No change to H&P.  Labs reviewed.  Patient's questions answered.    Linda Hedges, DO

## 2015-07-31 ENCOUNTER — Encounter (HOSPITAL_COMMUNITY): Payer: Self-pay | Admitting: Obstetrics & Gynecology

## 2015-07-31 LAB — COMPREHENSIVE METABOLIC PANEL
ALBUMIN: 2.7 g/dL — AB (ref 3.5–5.0)
ALK PHOS: 40 U/L (ref 38–126)
ALT: 7 U/L — ABNORMAL LOW (ref 14–54)
ANION GAP: 4 — AB (ref 5–15)
AST: 12 U/L — ABNORMAL LOW (ref 15–41)
BILIRUBIN TOTAL: 0.7 mg/dL (ref 0.3–1.2)
BUN: 5 mg/dL — AB (ref 6–20)
CALCIUM: 8 mg/dL — AB (ref 8.9–10.3)
CO2: 27 mmol/L (ref 22–32)
Chloride: 108 mmol/L (ref 101–111)
Creatinine, Ser: 0.73 mg/dL (ref 0.44–1.00)
GFR calc Af Amer: >60 mL/min (ref >60)
GLUCOSE: 133 mg/dL — AB (ref 65–99)
Potassium: 3.7 mmol/L (ref 3.5–5.1)
Sodium: 139 mmol/L (ref 135–145)
TOTAL PROTEIN: 6 g/dL — AB (ref 6.5–8.1)

## 2015-07-31 LAB — CBC
HEMATOCRIT: 30 % — AB (ref 36.0–46.0)
HEMOGLOBIN: 9.5 g/dL — AB (ref 12.0–15.0)
MCH: 21.9 pg — ABNORMAL LOW (ref 26.0–34.0)
MCHC: 31.7 g/dL (ref 30.0–36.0)
MCV: 69.1 fL — AB (ref 78.0–100.0)
Platelets: 261 10*3/uL (ref 150–400)
RBC: 4.34 MIL/uL (ref 3.87–5.11)
RDW: 17.8 % — AB (ref 11.5–15.5)
WBC: 11.1 10*3/uL — ABNORMAL HIGH (ref 4.0–10.5)

## 2015-07-31 MED ORDER — SODIUM CHLORIDE 0.9 % IJ SOLN: 3.0000 mL | INTRAMUSCULAR | Status: DC | PRN

## 2015-07-31 MED ORDER — SODIUM CHLORIDE 0.9 % IV SOLN: 250.0000 mL | INTRAVENOUS | Status: DC | PRN

## 2015-07-31 MED ORDER — SODIUM CHLORIDE 0.9 % IJ SOLN
3.0000 mL | Freq: Two times a day (BID) | INTRAMUSCULAR | Status: DC
  Administered 2015-07-31: 3 mL via INTRAVENOUS

## 2015-07-31 NOTE — Addendum Note (Signed)
Addendum  created 07/31/15 1118 by Brock Ra, CRNA   Modules edited: Notes Section   Notes Section:  File: [id number]

## 2015-07-31 NOTE — Progress Notes (Signed)
No current c/o.  Good pain control.  No n/v.  Tolerating po.  Foley in.  Ambulated last night without difficulty.    VSS.  AF. Labs stable (Hgb 9.5) UOP adequate and clear  Gen: A&O x 3 Abd: soft, ND, dressings c/d x 2 Ext: no c/c/e  POD#1 s/p TAH -Advance diet -Encourage ambulation -D/C Foley and IVF -Transition to po pain meds -Anticipate D/C tomorrow  Linda Hedges, DO

## 2015-07-31 NOTE — Anesthesia Postprocedure Evaluation (Signed)
  Anesthesia Post-op Note  Patient: Latasha Klein  Procedure(s) Performed: Procedure(s): ATTEMPTED LAPAROSCOPIC ASSISTED VAGINAL HYSTERECTOMY (N/A) HYSTERECTOMY ABDOMINAL (N/A) BILATERAL SALPINGECTOMY (Bilateral)  Patient Location: [facility]  Anesthesia Type:General  Level of Consciousness: awake, alert , oriented and patient cooperative  Airway and Oxygen Therapy: Patient Spontanous Breathing  Post-op Pain: mild  Post-op Assessment: Post-op Vital signs reviewed, Patient's Cardiovascular Status Stable, Respiratory Function Stable, Patent Airway, No signs of Nausea or vomiting, Adequate PO intake, Pain level controlled, No headache, No backache and Patient able to bend at knees              Post-op Vital Signs: Reviewed and stable  Last Vitals:  Filed Vitals:   07/31/15 1037  BP: 121/65  Pulse: 79  Temp: 36.6 C  Resp: 18    Complications: No apparent anesthesia complications

## 2015-08-01 MED ORDER — DOCUSATE SODIUM 100 MG PO CAPS: 100.0000 mg | ORAL_CAPSULE | Freq: Two times a day (BID) | ORAL | Status: DC

## 2015-08-01 MED ORDER — OXYCODONE-ACETAMINOPHEN 5-325 MG PO TABS: 1.0000 | ORAL_TABLET | ORAL | Status: DC | PRN

## 2015-08-01 MED ORDER — IBUPROFEN 800 MG PO TABS: 800.0000 mg | ORAL_TABLET | Freq: Four times a day (QID) | ORAL | Status: DC | PRN

## 2015-08-01 NOTE — Progress Notes (Signed)
Pt is discharged in the care of husband. No equipment needed for home use Stable Abdominal incisions are clean and dry. Understands all discharged instructions well Questions asked and answered. Denies bleeding or pain.

## 2015-08-01 NOTE — Progress Notes (Signed)
No complaints.  Voiding well.  Ambulating well.  Pain well controlled with medication.  Tolerating po.  VSS. AF. Adequate UOP  Gen: A&O x 3 Abd: Soft, ND, dressing c/d x 2 Ext: no c/c/e  POD#2 s/p TAH -Meeting all goals -Discharge home.  Linda Hedges, DO

## 2015-08-01 NOTE — Discharge Summary (Signed)
Physician Discharge Summary  Patient ID: Latasha Klein MRN: [id number] DOB/AGE: [birth year] 39 y.o.  Admit date: 07/30/2015 Discharge date: 08/01/2015  Admission Diagnoses:  Symptomatic fibroid uterus  Discharge Diagnoses: SAA Active Problems:   S/P hysterectomy   Discharged Condition: good  Hospital Course: Patient was admitted for anticipated LAVH, possible TAH to treat symptomatic fibroid uterus.  The laparoscopic procedure was converted to abdominal secondary to large uterus.  The TAH, b/l salpingectomy was performed without complication.  The patient did well postoperatively and on POD#2 was meeting all goals.  She was discharged home with f/u in office in 1-2 weeks.  Consults: None  Significant Diagnostic Studies: labs: Hgb 9.5  Treatments: surgery: Dx l/s, TAH, b/l salpingectomy  Discharge Exam: Blood pressure 114/70, pulse 80, temperature 98.7 F (37.1 C), temperature source Oral, resp. rate 16, height 5\' 6"  (1.676 m), weight 240 lb (108.863 kg), SpO2 100 %. General appearance: alert, cooperative and appears stated age GI: normal findings: appropriately ttp Extremities: extremities normal, atraumatic, no cyanosis or edema Incision/Wound: c/d dressings  Disposition: Final discharge disposition not confirmed     Medication List    STOP taking these medications        ADVIL 200 MG Caps  Generic drug:  Ibuprofen  Replaced by:  ibuprofen 800 MG tablet     MINASTRIN 24 FE 1-20 MG-MCG(24) Chew  Generic drug:  Norethin Ace-Eth Estrad-FE      TAKE these medications        calcium carbonate 500 MG chewable tablet  Commonly known as:  TUMS - dosed in mg elemental calcium  Chew 1 tablet by mouth daily as needed for heartburn.     CHLOR-TABLETS PO  Take 1 tablet by mouth daily as needed (seasonal allergies).     docusate sodium 100 MG capsule  Commonly known as:  COLACE  Take 1 capsule (100 mg total) by mouth 2 (two) times daily.     FERRAPLUS 90 PO  Take 1  tablet by mouth daily.     ibuprofen 800 MG tablet  Commonly known as:  ADVIL  Take 1 tablet (800 mg total) by mouth every 6 (six) hours as needed.     omeprazole 20 MG capsule  Commonly known as:  PRILOSEC  Take 1 capsule (20 mg total) by mouth daily.     oxyCODONE-acetaminophen 5-325 MG per tablet  Commonly known as:  PERCOCET/ROXICET  Take 1-2 tablets by mouth every 4 (four) hours as needed (moderate to severe pain (when tolerating fluids)).     traMADol-acetaminophen 37.5-325 MG per tablet  Commonly known as:  ULTRACET  Take 1 tablet by mouth every 4 (four) hours as needed for moderate pain or severe pain (back pain).         SignedLinda Hedges 08/01/2015, 9:27 AM

## 2015-08-01 NOTE — Discharge Instructions (Signed)
Call MD for T>100.4, heavy vaginal bleeding, severe abdominal pain, intractable nausea and/or vomiting, or respiratory distress.  Call office to schedule postop appointment in 1-2 weeks.  No driving while taking narcotics.  Pelvic rest x 6 weeks.  No heavy lifting.

## 2015-09-19 ENCOUNTER — Ambulatory Visit: Payer: Self-pay | Admitting: Physician Assistant

## 2015-09-19 ENCOUNTER — Encounter: Payer: Self-pay | Admitting: Physician Assistant

## 2015-09-19 VITALS — BP 130/80 | Temp 99.3°F | Wt 237.0 lb

## 2015-09-19 DIAGNOSIS — J0100 Acute maxillary sinusitis, unspecified: Secondary | ICD-10-CM

## 2015-09-19 MED ORDER — AMOXICILLIN 875 MG PO TABS: 875.0000 mg | ORAL_TABLET | Freq: Two times a day (BID) | ORAL | Status: DC

## 2015-09-19 MED ORDER — FLUTICASONE PROPIONATE 50 MCG/ACT NA SUSP: 2.0000 | Freq: Every day | NASAL | Status: DC

## 2015-09-19 NOTE — Progress Notes (Signed)
S: C/o runny nose and congestion for 3 days, ? fever, chills, denies cp/sob, v/d; mucus is green and thick, cough is sporadic, c/o of facial and dental pain. No flu shot yet, just released to go back to work after having hysterectomy, saw surgeon and was released, no signs of infection at the incision sites, no abd pain  Using otc meds: advil sinus and cold, nyquil  O: vitals w low grade temp at 99, hr a little elevated,  perrl eomi, normocephalic, tms dull, nasal mucosa red and swollen, throat injected, neck supple no lymph, lungs c t a, cv rrr, neuro intact  A:  Acute sinusitis   P: amoxil 875mg  bid x 10d, flonase; tylenol or motrin for fever as needed, if temp goes over 100 pt is to go home from work, otherwise wear mask while at work, drink fluids, continue regular meds , use otc meds of choice, return if not improving in 5 days, return earlier if worsening

## 2016-03-28 ENCOUNTER — Other Ambulatory Visit: Payer: Self-pay | Admitting: Internal Medicine

## 2016-03-28 NOTE — Telephone Encounter (Signed)
Patient last seen for CPE/establish care 02/13/15.  Left detailed message per DPR to call and schedule CPE.  #30 approved.

## 2016-04-29 DIAGNOSIS — M79671 Pain in right foot: Secondary | ICD-10-CM | POA: Diagnosis not present

## 2016-04-29 DIAGNOSIS — M21069 Valgus deformity, not elsewhere classified, unspecified knee: Secondary | ICD-10-CM | POA: Diagnosis not present

## 2016-04-29 DIAGNOSIS — M79672 Pain in left foot: Secondary | ICD-10-CM | POA: Diagnosis not present

## 2016-09-15 ENCOUNTER — Ambulatory Visit: Payer: Self-pay | Admitting: Family

## 2016-09-15 VITALS — BP 118/80 | HR 80 | Temp 98.5°F

## 2016-09-15 DIAGNOSIS — J019 Acute sinusitis, unspecified: Secondary | ICD-10-CM

## 2016-09-15 MED ORDER — AZITHROMYCIN 250 MG PO TABS: ORAL_TABLET | ORAL | 0 refills | Status: DC

## 2016-09-16 ENCOUNTER — Encounter: Payer: Self-pay | Admitting: Physician Assistant

## 2016-09-16 NOTE — Progress Notes (Signed)
S/ uri sxs x 2 weeks, blowing green , cough nonproductive , no fever, CP or SOB  Hx of allergies not taking meds   O/ VSS alert pleasant NAD congested nasally ENT - nasal turbinates swollen , somewhat blue with purulunt rhinorhea , R max tender  Throat clear neck supple Heart rsr lungs clear A/ acute rhinosinusitis  Allergies  P allergy tip sheet given . She has flonase on hand advised to begin  Regimen daily. rx for zpack 250 mg .  Supportive measures discussed. Follow up prn not improving

## 2016-09-17 ENCOUNTER — Telehealth: Payer: Self-pay | Admitting: Family

## 2016-09-17 ENCOUNTER — Other Ambulatory Visit: Payer: Self-pay | Admitting: Family

## 2016-09-17 DIAGNOSIS — J019 Acute sinusitis, unspecified: Secondary | ICD-10-CM

## 2016-09-17 NOTE — Telephone Encounter (Signed)
Called to say her rx did not go to [address]  I checked and it went to [facility] so I called her Zpack to Goldsmith FNP

## 2016-10-08 ENCOUNTER — Other Ambulatory Visit: Payer: Self-pay | Admitting: Obstetrics & Gynecology

## 2016-10-08 DIAGNOSIS — R928 Other abnormal and inconclusive findings on diagnostic imaging of breast: Secondary | ICD-10-CM

## 2016-10-10 ENCOUNTER — Ambulatory Visit
Admission: RE | Admit: 2016-10-10 | Discharge: 2016-10-10 | Disposition: A | Discharge status: Home / Self Care | Payer: Commercial Managed Care - HMO | Source: Ambulatory Visit | Attending: Obstetrics & Gynecology | Admitting: Obstetrics & Gynecology

## 2016-10-10 DIAGNOSIS — R928 Other abnormal and inconclusive findings on diagnostic imaging of breast: Secondary | ICD-10-CM

## 2017-06-01 ENCOUNTER — Ambulatory Visit: Payer: Self-pay | Admitting: Physician Assistant

## 2017-06-01 ENCOUNTER — Encounter: Payer: Self-pay | Admitting: Physician Assistant

## 2017-06-01 ENCOUNTER — Ambulatory Visit
Admission: RE | Admit: 2017-06-01 | Discharge: 2017-06-01 | Disposition: A | Discharge status: Home / Self Care | Payer: 59 | Source: Ambulatory Visit | Attending: Physician Assistant | Admitting: Physician Assistant

## 2017-06-01 VITALS — BP 140/85 | HR 94

## 2017-06-01 DIAGNOSIS — M79605 Pain in left leg: Secondary | ICD-10-CM | POA: Diagnosis present

## 2017-06-01 NOTE — Progress Notes (Signed)
S: c/o left lower leg swelling and pain, states she was at work last week and bumped into something, the leg was a little sore but over the weekend her foot, ankle, and leg began to swell, no cp/sob, no hx of htn, nonsmoker, no hormone replacement,   O: vitals wnl, nad, lungs c t a, cv rrr, lower left leg with swelling from above ankle to foot, some tenderness in calf, some tenderness at anterior, full rom, able to bw without difficulty, n/v intact  A: acute leg pain and swelling  P: stat US left lower leg

## 2017-06-16 ENCOUNTER — Encounter: Payer: Self-pay | Admitting: Internal Medicine

## 2017-06-16 ENCOUNTER — Ambulatory Visit (INDEPENDENT_AMBULATORY_CARE_PROVIDER_SITE_OTHER): Payer: 59 | Admitting: Internal Medicine

## 2017-06-16 VITALS — BP 124/82 | HR 109 | Temp 98.9°F | Wt 247.5 lb

## 2017-06-16 DIAGNOSIS — K219 Gastro-esophageal reflux disease without esophagitis: Secondary | ICD-10-CM

## 2017-06-16 DIAGNOSIS — M25572 Pain in left ankle and joints of left foot: Secondary | ICD-10-CM

## 2017-06-16 DIAGNOSIS — M25472 Effusion, left ankle: Secondary | ICD-10-CM | POA: Diagnosis not present

## 2017-06-16 DIAGNOSIS — G5602 Carpal tunnel syndrome, left upper limb: Secondary | ICD-10-CM | POA: Diagnosis not present

## 2017-06-16 MED ORDER — OMEPRAZOLE 20 MG PO CPDR: DELAYED_RELEASE_CAPSULE | ORAL | 2 refills | Status: DC

## 2017-06-16 NOTE — Assessment & Plan Note (Signed)
Prilosec refilled today Discussed how weight loss can help reduce reflux

## 2017-06-16 NOTE — Patient Instructions (Signed)
Elastic Bandage and RICE What does an elastic bandage do? Elastic bandages come in different shapes and sizes. They generally provide support to your injury and reduce swelling while you are healing, but they can perform different functions. Your health care provider will help you to decide what is best for your protection, recovery, or rehabilitation following an injury. What are some general tips for using an elastic bandage?  Use the bandage as directed by the maker of the bandage that you are using.  Do not wrap the bandage too tightly. This may cut off the circulation in the arm or leg in the area below the bandage. ? If part of your body beyond the bandage becomes blue, numb, cold, swollen, or is more painful, your bandage is most likely too tight. If this occurs, remove your bandage and reapply it more loosely.  See your health care provider if the bandage seems to be making your problems worse rather than better.  An elastic bandage should be removed and reapplied every 3-4 hours or as directed by your health care provider. What is RICE? The routine care of many injuries includes rest, ice, compression, and elevation (RICE therapy). Rest Rest is required to allow your body to heal. Generally, you can resume your routine activities when you are comfortable and have been given permission by your health care provider. Ice Icing your injury helps to keep the swelling down and it reduces pain. Do not apply ice directly to your skin.  Put ice in a plastic bag.  Place a towel between your skin and the bag.  Leave the ice on for 20 minutes, 2-3 times per day.  Do this for as long as you are directed by your health care provider. Compression Compression helps to keep swelling down, gives support, and helps with discomfort. Compression may be done with an elastic bandage. Elevation Elevation helps to reduce swelling and it decreases pain. If possible, your injured area should be placed at  or above the level of your heart or the center of your chest. When should I seek medical care? You should seek medical care if:  You have persistent pain and swelling.  Your symptoms are getting worse rather than improving.  These symptoms may indicate that further evaluation or further X-rays are needed. Sometimes, X-rays may not show a small broken bone (fracture) until a number of days later. Make a follow-up appointment with your health care provider. Ask when your X-ray results will be ready. Make sure that you get your X-ray results. When should I seek immediate medical care? You should seek immediate medical care if:  You have a sudden onset of severe pain at or below the area of your injury.  You develop redness or increased swelling around your injury.  You have tingling or numbness at or below the area of your injury that does not improve after you remove the elastic bandage.  This information is not intended to replace advice given to you by your health care provider. Make sure you discuss any questions you have with your health care provider. Document Released: 05/16/2002 Document Revised: 10/20/2016 Document Reviewed: 07/10/2014 Elsevier Interactive Patient Education  2017 [employer].

## 2017-06-16 NOTE — Progress Notes (Signed)
Subjective:    Patient ID: Latasha Klein, female    DOB: 1976/10/02, 41 y.o.   MRN: [id number]  HPI  Pt presents to the clinic today to follow up chronic conditions.  GERD: Triggerd by acidic foods. She takes the Prilosec intermittently with good relief. She is requesting a refill of that today.  She c/o numbness and tingling in her left hand. She reports this started 1 week ago. It seems worse during the day, especially when she types. She is right handed. She denies any injury to the area. She has not taken anything OTC for this.  She also c/o left ankle pain. This started 3 weeks ago, after she hit her leg on something. She has had pain and swelling since that time. She describes the pain as aching. She has associated numbness in her foot that radiates up her leg. She saw Ashok Cordia, PA. A LLE [place] was ordered, negative for DVT. She denies any injury to the area. She is taking Ibuprofen as needed with good relief.   Review of Systems      Past Medical History:  Diagnosis Date  . Abnormal uterine bleeding (AUB)   . Anemia   . Blood dyscrasia    beta thalacemia  . Chicken pox   . GERD (gastroesophageal reflux disease)   . Headache    occasional migraines    Current Outpatient Prescriptions  Medication Sig Dispense Refill  . amoxicillin (AMOXIL) 875 MG tablet Take 1 tablet (875 mg total) by mouth 2 (two) times daily. (Patient not taking: Reported on 09/15/2016) 20 tablet 0  . azithromycin (ZITHROMAX Z-PAK) 250 MG tablet As directed 6 each 0  . calcium carbonate (TUMS - DOSED IN MG ELEMENTAL CALCIUM) 500 MG chewable tablet Chew 1 tablet by mouth daily as needed for heartburn.     . Chlorpheniramine Maleate (CHLOR-TABLETS PO) Take 1 tablet by mouth daily as needed (seasonal allergies).    . docusate sodium (COLACE) 100 MG capsule Take 1 capsule (100 mg total) by mouth 2 (two) times daily. (Patient not taking: Reported on 09/15/2016) 10 capsule 0  . fluticasone (FLONASE) 50  MCG/ACT nasal spray Place 2 sprays into both nostrils daily. 16 g 6  . ibuprofen (ADVIL,MOTRIN) 800 MG tablet Take 1 tablet (800 mg total) by mouth every 6 (six) hours as needed. (Patient not taking: Reported on 09/15/2016) 30 tablet 1  . Iron-Folic [id number] (FERRAPLUS 90 PO) Take 1 tablet by mouth daily.    Marland Kitchen omeprazole (PRILOSEC) 20 MG capsule TAKE 1 CAPSULE (20 MG TOTAL) BY MOUTH DAILY. 30 capsule 0  . oxyCODONE-acetaminophen (PERCOCET/ROXICET) 5-325 MG per tablet Take 1-2 tablets by mouth every 4 (four) hours as needed (moderate to severe pain (when tolerating fluids)). (Patient not taking: Reported on 09/15/2016) 30 tablet 0  . traMADol-acetaminophen (ULTRACET) 37.5-325 MG per tablet Take 1 tablet by mouth every 4 (four) hours as needed for moderate pain or severe pain (back pain).     No current facility-administered medications for this visit.     Allergies  Allergen Reactions  . Latex Itching    Pt states itching with latex gloves.    Family History  Problem Relation Age of Onset  . Arthritis Mother   . Arthritis Maternal Aunt   . Cancer Maternal Aunt        Breast  . Arthritis Maternal Uncle   . Arthritis Maternal Grandmother   . Stroke Maternal Grandmother   . Cancer Paternal Grandfather  Prostate    Social History   Social History  . Marital status: Married    Spouse name: N/A  . Number of children: N/A  . Years of education: N/A   Occupational History  . Not on file.   Social History Main Topics  . Smoking status: Never Smoker  . Smokeless tobacco: Never Used  . Alcohol use 0.0 oz/week     Comment: occasional  . Drug use: No  . Sexual activity: Not on file   Other Topics Concern  . Not on file   Social History Narrative  . No narrative on file     Constitutional: Denies fever, malaise, fatigue, headache or abrupt weight changes.  Gastrointestinal: Pt reports reflux. Denies abdominal pain, bloating, constipation, diarrhea or blood in  the stool.  Musculoskeletal: Pt reports left ankle pain and swelling. Denies decrease in range of motion, difficulty with gait, muscle pain .  Neurological: Pt reports numbness and tingling in her left hand, numbness in her left foot. Denies dizziness, difficulty with memory, difficulty with speech or problems with balance and coordination.    No other specific complaints in a complete review of systems (except as listed in HPI above).  Objective:   Physical Exam   BP 124/82   Pulse (!) 109   Temp 98.9 F (37.2 C) (Oral)   Wt 247 lb 8 oz (112.3 kg)   LMP 07/13/2015 (Approximate)   SpO2 99%   BMI 39.95 kg/m  Wt Readings from Last 3 Encounters:  06/16/17 247 lb 8 oz (112.3 kg)  09/19/15 237 lb (107.5 kg)  07/30/15 240 lb (108.9 kg)    General: Appears her stated age, obese in NAD. Skin: No bruising noted. Abdomen: Soft and nontender. Normal bowel sounds. No distention or masses noted.  Musculoskeletal: Normal flexion, extension and rotation of the left ankle. Swelling noted over the lateral malleolus. Tender to palpation over the lateral malleolus.   Neurological: Alert and oriented. Positive Phalen's, Tinel's   BMET    Component Value Date/Time   NA 139 07/31/2015 0525   K 3.7 07/31/2015 0525   CL 108 07/31/2015 0525   CO2 27 07/31/2015 0525   GLUCOSE 133 (H) 07/31/2015 0525   BUN 5 (L) 07/31/2015 0525   CREATININE 0.73 07/31/2015 0525   CALCIUM 8.0 (L) 07/31/2015 0525   GFRNONAA >60 07/31/2015 0525   GFRAA >60 07/31/2015 0525    Lipid Panel     Component Value Date/Time   CHOL 145 02/13/2015 1013   TRIG 51.0 02/13/2015 1013   HDL 57.80 02/13/2015 1013   CHOLHDL 3 02/13/2015 1013   VLDL 10.2 02/13/2015 1013   LDLCALC 77 02/13/2015 1013    CBC    Component Value Date/Time   WBC 11.1 (H) 07/31/2015 0525   RBC 4.34 07/31/2015 0525   HGB 9.5 (L) 07/31/2015 0525   HCT 30.0 (L) 07/31/2015 0525   PLT 261 07/31/2015 0525   MCV 69.1 (L) 07/31/2015 0525   MCH  21.9 (L) 07/31/2015 0525   MCHC 31.7 07/31/2015 0525   RDW 17.8 (H) 07/31/2015 0525    Hgb A1C Lab Results  Component Value Date   HGBA1C 5.9 02/13/2015           Assessment & Plan:   Carpal Tunnel, Left Wrist:  Advised her to get a cock-up splint and wear this during the day Can take Ibuprofen or Aleve as needed  Left Ankle Pain and Swelling:  Seems like a sprain Discussed RICE therapy  Will will see how she is over the next week, return precautions discussed  Make an appt for your annual exam Webb Silversmith, NP

## 2017-09-07 DIAGNOSIS — E559 Vitamin D deficiency, unspecified: Secondary | ICD-10-CM | POA: Diagnosis not present

## 2017-12-10 DIAGNOSIS — E559 Vitamin D deficiency, unspecified: Secondary | ICD-10-CM | POA: Diagnosis not present

## 2017-12-10 DIAGNOSIS — Z01419 Encounter for gynecological examination (general) (routine) without abnormal findings: Secondary | ICD-10-CM | POA: Diagnosis not present

## 2017-12-10 DIAGNOSIS — Z1231 Encounter for screening mammogram for malignant neoplasm of breast: Secondary | ICD-10-CM | POA: Diagnosis not present

## 2017-12-10 DIAGNOSIS — Z6841 Body Mass Index (BMI) 40.0 and over, adult: Secondary | ICD-10-CM | POA: Diagnosis not present

## 2018-02-02 ENCOUNTER — Encounter: Payer: Self-pay | Admitting: Internal Medicine

## 2018-02-02 ENCOUNTER — Ambulatory Visit: Payer: 59 | Admitting: Internal Medicine

## 2018-02-02 VITALS — BP 122/76 | HR 103 | Temp 98.0°F | Wt 242.0 lb

## 2018-02-02 DIAGNOSIS — J069 Acute upper respiratory infection, unspecified: Secondary | ICD-10-CM

## 2018-02-02 DIAGNOSIS — J0100 Acute maxillary sinusitis, unspecified: Secondary | ICD-10-CM | POA: Diagnosis not present

## 2018-02-02 MED ORDER — METHYLPREDNISOLONE ACETATE 80 MG/ML IJ SUSP
80.0000 mg | Freq: Once | INTRAMUSCULAR | Status: AC
  Administered 2018-02-02: 80 mg via INTRAMUSCULAR

## 2018-02-02 MED ORDER — FLUTICASONE PROPIONATE 50 MCG/ACT NA SUSP: 2.0000 | Freq: Every day | NASAL | 2 refills | Status: DC

## 2018-02-02 NOTE — Progress Notes (Signed)
HPI  Pt presents to the clinic today with c/o ea fullness, nasal congestion and sore throat. She reports this started 5 days ago. She denies ear pain or decreased hearing. She is not blowing anything out of her nose. She does have some post nasal drip. She denies difficulty swallowing. She denies fever, chills or body aches. She has tried Mucinex, Ibuprofen and Advil Cold and Sinus with minimal relief. She has no history of allergies. She has had sick contacts.  Review of Systems      Past Medical History:  Diagnosis Date  . Abnormal uterine bleeding (AUB)   . Anemia   . Blood dyscrasia    beta thalacemia  . Chicken pox   . GERD (gastroesophageal reflux disease)   . Headache    occasional migraines    Family History  Problem Relation Age of Onset  . Arthritis Mother   . Arthritis Maternal Aunt   . Cancer Maternal Aunt        Breast  . Arthritis Maternal Uncle   . Arthritis Maternal Grandmother   . Stroke Maternal Grandmother   . Cancer Paternal Grandfather        Prostate    Social History   Socioeconomic History  . Marital status: Married    Spouse name: Not on file  . Number of children: Not on file  . Years of education: Not on file  . Highest education level: Not on file  Social Needs  . Financial resource strain: Not on file  . Food insecurity - worry: Not on file  . Food insecurity - inability: Not on file  . Transportation needs - medical: Not on file  . Transportation needs - non-medical: Not on file  Occupational History  . Not on file  Tobacco Use  . Smoking status: Never Smoker  . Smokeless tobacco: Never Used  Substance and Sexual Activity  . Alcohol use: Yes    Alcohol/week: 0.0 oz    Comment: occasional  . Drug use: No  . Sexual activity: Not on file  Other Topics Concern  . Not on file  Social History Narrative  . Not on file    Allergies  Allergen Reactions  . Latex Itching    Pt states itching with latex gloves.      Constitutional: Denies headache, fatigue, fever or abrupt weight changes.  HEENT:  Positive ear fullness, nasal congestion, sore throat. Denies eye redness, eye pain, pressure behind the eyes, facial pain, ear pain, ringing in the ears, wax buildup, runny nose or bloody nose. Respiratory: Denies cough, difficulty breathing or shortness of breath.  Cardiovascular: Denies chest pain, chest tightness, palpitations or swelling in the hands or feet.   No other specific complaints in a complete review of systems (except as listed in HPI above).  Objective:   BP 122/76   Pulse (!) 103   Temp 98 F (36.7 C) (Oral)   Wt 242 lb (109.8 kg)   LMP 07/13/2015 (Approximate)   SpO2 98%   BMI 39.06 kg/m  Wt Readings from Last 3 Encounters:  02/02/18 242 lb (109.8 kg)  06/16/17 247 lb 8 oz (112.3 kg)  09/19/15 237 lb (107.5 kg)     General: Appears her stated age, well developed, well nourished in NAD. HEENT: Head: normal shape and size, no sinus tenderness noted; Ears: Tm's gray and intact, normal light reflex; Nose: mucosa pink and moist, turbinates on the left side swollen; Throat/Mouth: + PND. Teeth present, mucosa erythematous and moist,  no exudate noted, no lesions or ulcerations noted.  Neck: No cervical lymphadenopathy.  Pulmonary/Chest: Normal effort and positive vesicular breath sounds. No respiratory distress. No wheezes, rales or ronchi noted.       Assessment & Plan:   Viral URI:  Get some rest and drink plenty of water Do salt water gargles for the sore throat Start Zyrtec and Flonase OTC 80 mg Depo IM today   RTC as needed or if symptoms persist.   Webb Silversmith, NP

## 2018-02-02 NOTE — Addendum Note (Signed)
Addended by: Lurlean Nanny on: 02/02/2018 11:04 AM   Modules accepted: Orders

## 2018-02-02 NOTE — Patient Instructions (Signed)

## 2018-05-12 ENCOUNTER — Encounter: Payer: Self-pay | Admitting: Internal Medicine

## 2018-05-12 ENCOUNTER — Ambulatory Visit: Payer: 59 | Admitting: Internal Medicine

## 2018-05-12 VITALS — BP 118/72 | HR 96 | Temp 98.3°F | Wt 240.0 lb

## 2018-05-12 DIAGNOSIS — J301 Allergic rhinitis due to pollen: Secondary | ICD-10-CM

## 2018-05-12 NOTE — Progress Notes (Signed)
HPI  Pt presents to the clinic today with c/o nasal congestion, ear fullness, sore throat and cough. She reports this started 3-4 days ago. She is blowing clear/yellow mucous out of her nose. She denies ear pain, decreased hearing. She denies difficulty swallowing. The cough is productive of clear/yellow mucous. She denies fever, chills or body aches. She has tried Zyrtec and Theraflu with minimal relief. She has a history of allergies. She has not had sick contacts.  Review of Systems     Past Medical History:  Diagnosis Date  . Abnormal uterine bleeding (AUB)   . Anemia   . Blood dyscrasia    beta thalacemia  . Chicken pox   . GERD (gastroesophageal reflux disease)   . Headache    occasional migraines    Family History  Problem Relation Age of Onset  . Arthritis Mother   . Arthritis Maternal Aunt   . Cancer Maternal Aunt        Breast  . Arthritis Maternal Uncle   . Arthritis Maternal Grandmother   . Stroke Maternal Grandmother   . Cancer Paternal Grandfather        Prostate    Social History   Socioeconomic History  . Marital status: Married    Spouse name: Not on file  . Number of children: Not on file  . Years of education: Not on file  . Highest education level: Not on file  Occupational History  . Not on file  Social Needs  . Financial resource strain: Not on file  . Food insecurity:    Worry: Not on file    Inability: Not on file  . Transportation needs:    Medical: Not on file    Non-medical: Not on file  Tobacco Use  . Smoking status: Never Smoker  . Smokeless tobacco: Never Used  Substance and Sexual Activity  . Alcohol use: Yes    Alcohol/week: 0.0 oz    Comment: occasional  . Drug use: No  . Sexual activity: Not on file  Lifestyle  . Physical activity:    Days per week: Not on file    Minutes per session: Not on file  . Stress: Not on file  Relationships  . Social connections:    Talks on phone: Not on file    Gets together: Not on file     Attends religious service: Not on file    Active member of club or organization: Not on file    Attends meetings of clubs or organizations: Not on file    Relationship status: Not on file  . Intimate partner violence:    Fear of current or ex partner: Not on file    Emotionally abused: Not on file    Physically abused: Not on file    Forced sexual activity: Not on file  Other Topics Concern  . Not on file  Social History Narrative  . Not on file    Allergies  Allergen Reactions  . Latex Itching    Pt states itching with latex gloves.     Constitutional: Denies headache, fatigue, fever or abrupt weight changes.  HEENT:  Positive ear fullness, nasal congestion and sore throat. Denies eye redness, ear pain, ringing in the ears, wax buildup, runny nose or bloody nose. Respiratory: Positive cough. Denies difficulty breathing or shortness of breath.  Cardiovascular: Denies chest pain, chest tightness, palpitations or swelling in the hands or feet.   No other specific complaints in a complete review of systems (  except as listed in HPI above).  Objective:   BP 118/72   Pulse 96   Temp 98.3 F (36.8 C) (Oral)   Wt 240 lb (108.9 kg)   LMP 07/13/2015 (Approximate)   SpO2 98%   BMI 38.74 kg/m   General: Appears her stated age, in NAD. HEENT: Head: normal shape and size, no, sinus tenderness noted;  Ears: Tm's gray and intact, normal light reflex; Nose: mucosa boggy and moist, septum midline; Throat/Mouth: + PND. Teeth present, mucosa erythematous and moist, no exudate noted, no lesions or ulcerations noted.  Neck:  Bilateral anterior cervical adenopathy noted.  Cardiovascular: Normal rate and rhythm. S1,S2 noted.  No murmur, rubs or gallops noted.  Pulmonary/Chest: Normal effort and positive vesicular breath sounds. No respiratory distress. No wheezes, rales or ronchi noted.       Assessment & Plan:   Allergic Rhinitis:  Can use a Neti Pot which can be purchased from your  local drug store. Flonase 2 sprays each nostril for 3 days and then as needed. Continue Zyrtec 80 mg Depo IM today  RTC as needed or if symptoms persist. Webb Silversmith, NP

## 2018-05-12 NOTE — Patient Instructions (Signed)

## 2018-12-06 DIAGNOSIS — S39012A Strain of muscle, fascia and tendon of lower back, initial encounter: Secondary | ICD-10-CM | POA: Diagnosis not present

## 2018-12-30 ENCOUNTER — Ambulatory Visit: Payer: 59 | Admitting: Family Medicine

## 2018-12-30 ENCOUNTER — Ambulatory Visit (INDEPENDENT_AMBULATORY_CARE_PROVIDER_SITE_OTHER): Payer: 59

## 2018-12-30 VITALS — BP 112/78 | HR 94 | Temp 98.4°F | Resp 16 | Ht 66.0 in | Wt 223.4 lb

## 2018-12-30 DIAGNOSIS — M25552 Pain in left hip: Secondary | ICD-10-CM

## 2018-12-30 DIAGNOSIS — M545 Low back pain, unspecified: Secondary | ICD-10-CM

## 2018-12-30 DIAGNOSIS — J029 Acute pharyngitis, unspecified: Secondary | ICD-10-CM | POA: Diagnosis not present

## 2018-12-30 DIAGNOSIS — M5416 Radiculopathy, lumbar region: Secondary | ICD-10-CM | POA: Diagnosis not present

## 2018-12-30 DIAGNOSIS — Z20818 Contact with and (suspected) exposure to other bacterial communicable diseases: Secondary | ICD-10-CM

## 2018-12-30 LAB — POC INFLUENZA A&B (BINAX/QUICKVUE)
INFLUENZA A, POC: NEGATIVE
Influenza B, POC: NEGATIVE

## 2018-12-30 LAB — POCT RAPID STREP A (OFFICE): Rapid Strep A Screen: NEGATIVE

## 2018-12-30 MED ORDER — AZITHROMYCIN 250 MG PO TABS: ORAL_TABLET | ORAL | 0 refills | Status: DC

## 2018-12-30 NOTE — Progress Notes (Signed)
Subjective:    Patient ID: Latasha Klein, female    DOB: Jan 06, 1976, 43 y.o.   MRN: [id number]  HPI  Patient presents to clinic complaining of sore throat, feeling rundown, feeling achy for past 1 day.  Patient states her husband was diagnosed with strep throat yesterday and is currently on antibiotics.  Patient did get her flu vaccine for the current flu season.  Denies any fever or chills.  Denies nausea/vomiting or diarrhea.  Denies chest pain, shortness of breath or wheezing.  Patient also reports low back and left hip/upper left leg pain for past 4 to 6 months.  Patient states at first she thought the pain was related to her hernia in her groin, had her GYN checked this and no hernia was found.  Patient does use Aleve as needed with some success in helping to reduce pain.  Patient is concerned that something else could be going on and would like further imaging to assess.   Patient Active Problem List   Diagnosis Date Noted  . GERD (gastroesophageal reflux disease) 02/13/2015  . Irregular menses 02/13/2015   Social History   Tobacco Use  . Smoking status: Never Smoker  . Smokeless tobacco: Never Used  Substance Use Topics  . Alcohol use: Yes    Alcohol/week: 0.0 standard drinks    Comment: occasional   Review of Systems   Constitutional: Negative for chills, fatigue and fever.  HENT: +sore throat, nasal congestion.   Eyes: Negative.   Respiratory: Negative for cough, shortness of breath and wheezing.   Cardiovascular: Negative for chest pain, palpitations and leg swelling.  Gastrointestinal: Negative for abdominal pain, diarrhea, nausea and vomiting.  Genitourinary: Negative for dysuria, frequency and urgency.  Musculoskeletal: +low back and left hip pain.  Skin: Negative for color change, pallor and rash.  Neurological: Negative for syncope, light-headedness and headaches.  Psychiatric/Behavioral: The patient is not nervous/anxious.       Objective:   Physical  Exam Vitals signs and nursing note reviewed.  Constitutional:      General: She is not in acute distress.    Appearance: She is diaphoretic. She is not toxic-appearing.  HENT:     Head: Normocephalic and atraumatic.     Ears:     Comments: +fullness bilat TMs    Mouth/Throat:     Mouth: Mucous membranes are moist.     Pharynx: Uvula midline. Posterior oropharyngeal erythema (some redness in back of throat and post nasal drip. ) present. No oropharyngeal exudate or uvula swelling.  Eyes:     Conjunctiva/sclera: Conjunctivae normal.  Cardiovascular:     Rate and Rhythm: Normal rate and regular rhythm.  Pulmonary:     Effort: Pulmonary effort is normal. No respiratory distress.     Breath sounds: Normal breath sounds.  Musculoskeletal:     Lumbar back: She exhibits tenderness.       Back:       Legs:     Comments: +left sided low back pain radiating down into left hip. Left straight leg raise, adduction and abduction of left leg at hip joint all cause pain. Quadriceps strength equal and strong. No limping or antalgic gait.   Lymphadenopathy:     Cervical: No cervical adenopathy.  Skin:    Coloration: Skin is not pale.  Neurological:     Mental Status: She is alert and oriented to person, place, and time.  Psychiatric:        Mood and Affect: Mood normal.  Behavior: Behavior normal.    Vitals:   12/30/18 1548  BP: 112/78  Pulse: 94  Resp: 16  Temp: 98.4 F (36.9 C)  SpO2: 98%      Assessment & Plan:   Sore throat/strep exposure -- Due to strep throat exposure from husband and symptoms of sore throat and achiness, will cover patient with azithromycin to treat strep bacteria.  Patient states she is unable to take amoxicillin, does not sit well with her.  Rapid strep and flu testing are negative in clinic.  Patient advised to rest, keep up good fluid intake, do good handwashing.  Advised she can alternate Tylenol/Aleve as needed for pain.  Also advised to get a new  toothbrush.  Low back pain/left hip pain- suspect pain is from an arthritis in either low back or hip, possibly both.  We will get x-rays to further investigate.  Advised she can continue to use Aleve as needed.  Based on x-ray results we will determine next step in plan of care which could include referral to orthopedics or sports medicine for further evaluation/management, possible referral to physical therapy.  Patient will keep regularly scheduled follow-up with PCP as planned.  Advised to return to clinic sooner if any issues arise.

## 2019-01-01 LAB — CULTURE, UPPER RESPIRATORY
MICRO NUMBER: 95423
SPECIMEN QUALITY:: ADEQUATE

## 2019-02-16 DIAGNOSIS — Z6837 Body mass index (BMI) 37.0-37.9, adult: Secondary | ICD-10-CM | POA: Diagnosis not present

## 2019-02-16 DIAGNOSIS — Z1231 Encounter for screening mammogram for malignant neoplasm of breast: Secondary | ICD-10-CM | POA: Diagnosis not present

## 2019-02-16 DIAGNOSIS — Z01419 Encounter for gynecological examination (general) (routine) without abnormal findings: Secondary | ICD-10-CM | POA: Diagnosis not present

## 2019-12-30 ENCOUNTER — Telehealth: Payer: No Typology Code available for payment source | Admitting: Physician Assistant

## 2019-12-30 DIAGNOSIS — J3489 Other specified disorders of nose and nasal sinuses: Secondary | ICD-10-CM

## 2019-12-30 DIAGNOSIS — J019 Acute sinusitis, unspecified: Secondary | ICD-10-CM | POA: Diagnosis not present

## 2019-12-30 MED ORDER — AZITHROMYCIN 250 MG PO TABS: ORAL_TABLET | ORAL | 0 refills | Status: DC

## 2019-12-30 NOTE — Progress Notes (Signed)
Unable to reply in my chart.  Pt called to discuss Dx, Rx and Plan for follow-up.     We are sorry that you are not feeling well.  Here is how we plan to help!  Based on what you have shared with me it looks like you have sinusitis.  Sinusitis is inflammation and infection in the sinus cavities of the head.  Based on your presentation I believe you most likely have Acute Bacterial Sinusitis.  This is an infection caused by bacteria and is treated with antibiotics. I have prescribed Azithromycin. You may use an oral decongestant such as Mucinex D or if you have glaucoma or high blood pressure use plain Mucinex. Saline nasal spray help and can safely be used as often as needed for congestion.  If you develop worsening sinus pain, fever or notice severe headache and vision changes, or if symptoms are not better after completion of antibiotic, please schedule an appointment with a health care provider.    Sinus infections are not as easily transmitted as other respiratory infection, however we still recommend that you avoid close contact with loved ones, especially the very young and elderly.  Remember to wash your hands thoroughly throughout the day as this is the number one way to prevent the spread of infection!  Home Care:  Only take medications as instructed by your medical team.  Complete the entire course of an antibiotic.  Do not take these medications with alcohol.  A steam or ultrasonic humidifier can help congestion.  You can place a towel over your head and breathe in the steam from hot water coming from a faucet.  Avoid close contacts especially the very young and the elderly.  Cover your mouth when you cough or sneeze.  Always remember to wash your hands.  Get Help Right Away If:  You develop worsening fever or sinus pain.  You develop a severe head ache or visual changes.  Your symptoms persist after you have completed your treatment plan.  Make sure you  Understand  these instructions.  Will watch your condition.  Will get help right away if you are not doing well or get worse.  Your e-visit answers were reviewed by a board certified advanced clinical practitioner to complete your personal care plan.  Depending on the condition, your plan could have included both over the counter or prescription medications.  If there is a problem please reply  once you have received a response from your provider.  Your safety is important to [place].  If you have drug allergies check your prescription carefully.    You can use MyChart to ask questions about today's visit, request a non-urgent call back, or ask for a work or school excuse for 24 hours related to this e-Visit. If it has been greater than 24 hours you will need to follow up with your provider, or enter a new e-Visit to address those concerns.  You will get an e-mail in the next two days asking about your experience.  I hope that your e-visit has been valuable and will speed your recovery. Thank you for using e-visits.   Greater than 5 minutes, yet less than 10 minutes of time have been spent researching, coordinating, and implementing care for this patient today

## 2020-04-30 ENCOUNTER — Telehealth: Payer: Self-pay | Admitting: Internal Medicine

## 2020-04-30 NOTE — Telephone Encounter (Signed)
Pt is requesting an appointment for a steroid shot  Pt c/o PND, head congestion, runny nose x 2-3 months. Pt states that its all allergies and she cannot seem to get rid of the sinus inflammation. Pt states that no OTC allergy med is touching her symptoms.   Pt has tried Human resources officer, Claritin, Zyrtec.   Denies cough, wheezing, SOB, sore throat. No Fever, chills, body aches, N/V or Diarrhea.  Pt gets off today at 2pm if able to be worked in and seen in office.

## 2020-04-30 NOTE — Telephone Encounter (Signed)
She can't been see in the office. She can schedule a virtual and we can discuss oral steroids but we can't have her come in with those symptoms for a steroid shot.

## 2020-05-01 NOTE — Telephone Encounter (Signed)
Pt is scheduled °

## 2020-05-02 ENCOUNTER — Telehealth (INDEPENDENT_AMBULATORY_CARE_PROVIDER_SITE_OTHER): Payer: Managed Care, Other (non HMO) | Admitting: Internal Medicine

## 2020-05-02 DIAGNOSIS — J301 Allergic rhinitis due to pollen: Secondary | ICD-10-CM

## 2020-05-02 MED ORDER — PREDNISONE 10 MG PO TABS: ORAL_TABLET | ORAL | 0 refills | Status: DC

## 2020-05-03 ENCOUNTER — Encounter: Payer: Self-pay | Admitting: Internal Medicine

## 2020-05-03 NOTE — Progress Notes (Signed)
Virtual Visit via Video Note  I connected with Latasha Klein on 05/03/20 at  3:00 PM EDT by a video enabled telemedicine application and verified that I am speaking with the correct person using two identifiers.  Location: Patient: Work Provider: Office   I discussed the limitations of evaluation and management by telemedicine and the availability of in person appointments. The patient expressed understanding and agreed to proceed.  History of Present Illness:  Patient reports headache, runny nose and scratchy throat.  She reports this started 4 to 5 months ago.  The headache is located in her forehead.  She describes the pain as pressure.  She denies visual or dizziness.  She is blowing clear mucus out of her nose.  She denies sore throat or difficulty swallowing.  She denies nasal congestion, ear pain, loss of taste or smell, cough or shortness of breath.  She denies fever, chills or body aches.  She has tried Zyrtec, Claritin, Allegra, Tylenol Sinus, Flonase with minimal.  She has been prescribed antibiotics for sinus infection which did not provide much relief.  She would like a referral to an allergist so that she can know what she is allergic to.   Past Medical History:  Diagnosis Date  . Abnormal uterine bleeding (AUB)   . Anemia   . Blood dyscrasia    beta thalacemia  . Chicken pox   . GERD (gastroesophageal reflux disease)   . Headache    occasional migraines    Current Outpatient Medications  Medication Sig Dispense Refill  . calcium carbonate (TUMS - DOSED IN MG ELEMENTAL CALCIUM) 500 MG chewable tablet Chew 1 tablet by mouth daily as needed for heartburn.     . cetirizine (ZYRTEC) 10 MG chewable tablet Chew 10 mg by mouth daily.    . Cholecalciferol (VITAMIN D) 2000 units CAPS Take 1 capsule by mouth daily.    . fluticasone (FLONASE) 50 MCG/ACT nasal spray Place 2 sprays into both nostrils daily. 16 g 2  . omeprazole (PRILOSEC) 20 MG capsule TAKE 1 CAPSULE (20 MG TOTAL)  BY MOUTH DAILY. 30 capsule 2  . predniSONE (DELTASONE) 10 MG tablet Take 3 tabs on days 1-2, take 2 tabs on days 3-4, take 1 tab on days 5-6 12 tablet 0   No current facility-administered medications for this visit.    Allergies  Allergen Reactions  . Amoxicillin   . Latex Itching    Pt states itching with latex gloves.    Family History  Problem Relation Age of Onset  . Arthritis Mother   . Arthritis Maternal Aunt   . Cancer Maternal Aunt        Breast  . Arthritis Maternal Uncle   . Arthritis Maternal Grandmother   . Stroke Maternal Grandmother   . Cancer Paternal Grandfather        Prostate    Social History   Socioeconomic History  . Marital status: Married    Spouse name: Not on file  . Number of children: Not on file  . Years of education: Not on file  . Highest education level: Not on file  Occupational History  . Not on file  Tobacco Use  . Smoking status: Never Smoker  . Smokeless tobacco: Never Used  Substance and Sexual Activity  . Alcohol use: Yes    Alcohol/week: 0.0 standard drinks    Comment: occasional  . Drug use: No  . Sexual activity: Not on file  Other Topics Concern  . Not on  file  Social History Narrative  . Not on file   Social Determinants of Health   Financial Resource Strain:   . Difficulty of Paying Living Expenses:   Food Insecurity:   . Worried About Charity fundraiser in the Last Year:   . Arboriculturist in the Last Year:   Transportation Needs:   . Film/video editor (Medical):   Marland Kitchen Lack of Transportation (Non-Medical):   Physical Activity:   . Days of Exercise per Week:   . Minutes of Exercise per Session:   Stress:   . Feeling of Stress :   Social Connections:   . Frequency of Communication with Friends and Family:   . Frequency of Social Gatherings with Friends and Family:   . Attends Religious Services:   . Active Member of Clubs or Organizations:   . Attends Archivist Meetings:   Marland Kitchen Marital  Status:   Intimate Partner Violence:   . Fear of Current or Ex-Partner:   . Emotionally Abused:   Marland Kitchen Physically Abused:   . Sexually Abused:      Constitutional: Patient reports headache.  Denies fever, malaise, fatigue, or abrupt weight changes.  HEENT: Patient reports runny nose, scratchy throat.  Denies eye pain, eye redness, ear pain, ringing in the ears, wax buildup, nasal congestion, bloody nose, or sore throat. Respiratory: Denies difficulty breathing, shortness of breath, cough or sputum production.   Cardiovascular: Denies chest pain, chest tightness, palpitations or swelling in the hands or feet.   No other specific complaints in a complete review of systems (except as listed in HPI above).  Observations/Objective: LMP 07/13/2015 (Approximate)  Wt Readings from Last 3 Encounters:  12/30/18 223 lb 6.4 oz (101.3 kg)  05/12/18 240 lb (108.9 kg)  02/02/18 242 lb (109.8 kg)    General: Appears her stated age, well developed, well nourished in NAD. HEENT: Head: normal shape and size; Nose: Congestion noted; Throat/Mouth: No hoarseness noted.  Pulmonary/Chest: Normal effort. No respiratory distress.   Neurological: Alert and oriented.   BMET    Component Value Date/Time   NA 139 07/31/2015 0525   K 3.7 07/31/2015 0525   CL 108 07/31/2015 0525   CO2 27 07/31/2015 0525   GLUCOSE 133 (H) 07/31/2015 0525   BUN 5 (L) 07/31/2015 0525   CREATININE 0.73 07/31/2015 0525   CALCIUM 8.0 (L) 07/31/2015 0525   GFRNONAA >60 07/31/2015 0525   GFRAA >60 07/31/2015 0525    Lipid Panel     Component Value Date/Time   CHOL 145 02/13/2015 1013   TRIG 51.0 02/13/2015 1013   HDL 57.80 02/13/2015 1013   CHOLHDL 3 02/13/2015 1013   VLDL 10.2 02/13/2015 1013   LDLCALC 77 02/13/2015 1013    CBC    Component Value Date/Time   WBC 11.1 (H) 07/31/2015 0525   RBC 4.34 07/31/2015 0525   HGB 9.5 (L) 07/31/2015 0525   HCT 30.0 (L) 07/31/2015 0525   PLT 261 07/31/2015 0525   MCV 69.1  (L) 07/31/2015 0525   MCH 21.9 (L) 07/31/2015 0525   MCHC 31.7 07/31/2015 0525   RDW 17.8 (H) 07/31/2015 0525    Hgb A1C Lab Results  Component Value Date   HGBA1C 5.9 02/13/2015        Assessment and Plan: Allergic Rhinitis:  Rx for Pred taper sent to pharmacy Continue antihistamine OTC and Flonase Referral to allergy per request  Follow Up Instructions:    I discussed the assessment and  treatment plan with the patient. The patient was provided an opportunity to ask questions and all were answered. The patient agreed with the plan and demonstrated an understanding of the instructions.   The patient was advised to call back or seek an in-person evaluation if the symptoms worsen or if the condition fails to improve as anticipated.    Webb Silversmith, NP \

## 2020-05-03 NOTE — Patient Instructions (Signed)
Allergic Rhinitis, Adult Allergic rhinitis is a reaction to allergens in the air. Allergens are tiny specks (particles) in the air that cause your body to have an allergic reaction. This condition cannot be passed from person to person (is not contagious). Allergic rhinitis cannot be cured, but it can be controlled. There are two types of allergic rhinitis:  Seasonal. This type is also called hay fever. It happens only during certain times of the year.  Perennial. This type can happen at any time of the year. What are the causes? This condition may be caused by:  Pollen from grasses, trees, and weeds.  House dust mites.  Pet dander.  Mold. What are the signs or symptoms? Symptoms of this condition include:  Sneezing.  Runny or stuffy nose (nasal congestion).  A lot of mucus in the back of the throat (postnasal drip).  Itchy nose.  Tearing of the eyes.  Trouble sleeping.  Being sleepy during day. How is this treated? There is no cure for this condition. You should avoid things that trigger your symptoms (allergens). Treatment can help to relieve symptoms. This may include:  Medicines that block allergy symptoms, such as antihistamines. These may be given as a shot, nasal spray, or pill.  Shots that are given until your body becomes less sensitive to the allergen (desensitization).  Stronger medicines, if all other treatments have not worked. Follow these instructions at home: Avoiding allergens   Find out what you are allergic to. Common allergens include smoke, dust, and pollen.  Avoid them if you can. These are some of the things that you can do to avoid allergens: ? Replace carpet with wood, tile, or vinyl flooring. Carpet can trap dander and dust. ? Clean any mold found in the home. ? Do not smoke. Do not allow smoking in your home. ? Change your heating and air conditioning filter at least once a month. ? During allergy season:  Keep windows closed as much as  you can. If possible, use air conditioning when there is a lot of pollen in the air.  Use a special filter for allergies with your furnace and air conditioner.  Plan outdoor activities when pollen counts are lowest. This is usually during the early morning or evening hours.  If you do go outdoors when pollen count is high, wear a special mask for people with allergies.  When you come indoors, take a shower and change your clothes before sitting on furniture or bedding. General instructions  Do not use fans in your home.  Do not hang clothes outside to dry.  Wear sunglasses to keep pollen out of your eyes.  Wash your hands right away after you touch household pets.  Take over-the-counter and prescription medicines only as told by your doctor.  Keep all follow-up visits as told by your doctor. This is important. Contact a doctor if:  You have a fever.  You have a cough that does not go away (is persistent).  You start to make whistling sounds when you breathe (wheeze).  Your symptoms do not get better with treatment.  You have thick fluid coming from your nose.  You start to have nosebleeds. Get help right away if:  Your tongue or your lips are swollen.  You have trouble breathing.  You feel dizzy or you feel like you are going to pass out (faint).  You have cold sweats. Summary  Allergic rhinitis is a reaction to allergens in the air.  This condition may be   caused by allergens. These include pollen, dust mites, pet dander, and mold.  Symptoms include a runny, itchy nose, sneezing, or tearing eyes. You may also have trouble sleeping or feel sleepy during the day.  Treatment includes taking medicines and avoiding allergens. You may also get shots or take stronger medicines.  Get help if you have a fever or a cough that does not stop. Get help right away if you are short of breath. This information is not intended to replace advice given to you by your health care  provider. Make sure you discuss any questions you have with your health care provider. Document Revised: 03/15/2019 Document Reviewed: 06/15/2018 Elsevier Patient Education  2020 Elsevier Inc.  

## 2020-07-14 ENCOUNTER — Telehealth: Payer: Managed Care, Other (non HMO) | Admitting: Nurse Practitioner

## 2020-07-14 DIAGNOSIS — J029 Acute pharyngitis, unspecified: Secondary | ICD-10-CM

## 2020-07-14 NOTE — Progress Notes (Signed)

## 2020-07-14 NOTE — Progress Notes (Signed)

## 2020-07-14 NOTE — Progress Notes (Signed)
Erroneous evisit- was repeat from this morning. Her first evisit would not allow a response to patient. It did the same thing on second evisit as well. So I called patient and gave her verbal response. I had to leave a voice mail on her phone.

## 2020-07-15 ENCOUNTER — Encounter (HOSPITAL_COMMUNITY): Payer: Self-pay

## 2020-07-15 ENCOUNTER — Other Ambulatory Visit: Payer: Self-pay

## 2020-07-15 ENCOUNTER — Ambulatory Visit (HOSPITAL_COMMUNITY)
Admission: EM | Admit: 2020-07-15 | Discharge: 2020-07-15 | Disposition: A | Discharge status: Home / Self Care | Payer: Managed Care, Other (non HMO) | Attending: Emergency Medicine | Admitting: Emergency Medicine

## 2020-07-15 DIAGNOSIS — J069 Acute upper respiratory infection, unspecified: Secondary | ICD-10-CM | POA: Diagnosis not present

## 2020-07-15 MED ORDER — BENZONATATE 100 MG PO CAPS: 100.0000 mg | ORAL_CAPSULE | Freq: Three times a day (TID) | ORAL | 0 refills | Status: DC | PRN

## 2020-07-15 MED ORDER — IPRATROPIUM BROMIDE 0.06 % NA SOLN: 2.0000 | Freq: Four times a day (QID) | NASAL | 12 refills | Status: DC | PRN

## 2020-07-15 NOTE — ED Triage Notes (Signed)
Pt present cough with nasal congestion, pain in both ears and sore throat. Symptoms started on Wednesday. Pt tried OTC medication with no relief.

## 2020-07-15 NOTE — ED Provider Notes (Signed)
[facility]    CSN: [id number] Arrival date & time: 07/15/20  1344      History   Chief Complaint Chief Complaint  Patient presents with  . Cough  . Sore Throat  . Nasal Congestion    HPI Latasha Klein is a 44 y.o. female.   [name] presents with complaints of cough. Her husband had same illness first. 8/4 symptoms started. Headache, nasal drainage, chills, fatigue, sore throat, ulcer in mouth, pain to both ears (L>R). Her husband is still coughing. Her cough is productive of yellow mucus. No known fevers, although does sweat at night. Nausea, no vomiting. covid tested, PCR, negative on 8/6. Husband also negative by PCR. No asthma, no COPD, doesn't smoke. Alka seltzer cough/cold, as well as tylenol sinus, ibuprofen, these have helped some. Today feels better than had been feeling. Some shortness of breath due to nasal congestion. History of allergies, takes nasal sprays, eye drops and oral allergy medications.    ROS per HPI, negative if not otherwise mentioned.      Past Medical History:  Diagnosis Date  . Abnormal uterine bleeding (AUB)   . Anemia   . Blood dyscrasia    beta thalacemia  . Chicken pox   . GERD (gastroesophageal reflux disease)   . Headache    occasional migraines    Patient Active Problem List   Diagnosis Date Noted  . GERD (gastroesophageal reflux disease) 02/13/2015  . Irregular menses 02/13/2015    Past Surgical History:  Procedure Laterality Date  . ABDOMINAL HYSTERECTOMY N/A 07/30/2015   Procedure: HYSTERECTOMY ABDOMINAL;  Surgeon: Linda Hedges, DO;  Location: [facility];  Service: Gynecology;  Laterality: N/A;  . BILATERAL SALPINGECTOMY Bilateral 07/30/2015   Procedure: BILATERAL SALPINGECTOMY;  Surgeon: Linda Hedges, DO;  Location: [facility];  Service: Gynecology;  Laterality: Bilateral;  . LAPAROSCOPIC ASSISTED VAGINAL HYSTERECTOMY N/A 07/30/2015   Procedure: ATTEMPTED LAPAROSCOPIC ASSISTED VAGINAL HYSTERECTOMY;  Surgeon:  Linda Hedges, DO;  Location: [facility];  Service: Gynecology;  Laterality: N/A;  . WISDOM TOOTH EXTRACTION      OB History   No obstetric history on file.      Home Medications    Prior to Admission medications   Medication Sig Start Date End Date Taking? Authorizing Provider  benzonatate (TESSALON) 100 MG capsule Take 1-2 capsules (100-200 mg total) by mouth 3 (three) times daily as needed for cough. 07/15/20   Zigmund Gottron, NP  calcium carbonate (TUMS - DOSED IN MG ELEMENTAL CALCIUM) 500 MG chewable tablet Chew 1 tablet by mouth daily as needed for heartburn.     [provider]  cetirizine (ZYRTEC) 10 MG chewable tablet Chew 10 mg by mouth daily.    [provider]  Cholecalciferol (VITAMIN D) 2000 units CAPS Take 1 capsule by mouth daily.    [provider]  fluticasone (FLONASE) 50 MCG/ACT nasal spray Place 2 sprays into both nostrils daily. 02/02/18   Jearld Fenton, NP  ipratropium (ATROVENT) 0.06 % nasal spray Place 2 sprays into both nostrils 4 (four) times daily as needed for rhinitis. 07/15/20   Zigmund Gottron, NP  omeprazole (PRILOSEC) 20 MG capsule TAKE 1 CAPSULE (20 MG TOTAL) BY MOUTH DAILY. 06/16/17   Jearld Fenton, NP  predniSONE (DELTASONE) 10 MG tablet Take 3 tabs on days 1-2, take 2 tabs on days 3-4, take 1 tab on days 5-6 05/02/20   Jearld Fenton, NP    Family History Family History  Problem Relation Age of Onset  . Arthritis Mother   . Arthritis Maternal Aunt   . Cancer Maternal Aunt        Breast  . Arthritis Maternal Uncle   . Arthritis Maternal Grandmother   . Stroke Maternal Grandmother   . Cancer Paternal Grandfather        Prostate    Social History Social History   Tobacco Use  . Smoking status: Never Smoker  . Smokeless tobacco: Never Used  Substance Use Topics  . Alcohol use: Yes    Alcohol/week: 0.0 standard drinks    Comment: occasional  . Drug use: No     Allergies   Amoxicillin and Latex   Review of  Systems Review of Systems   Physical Exam Triage Vital Signs ED Triage Vitals  Enc Vitals Group     BP 07/15/20 1535 120/85     Pulse Rate 07/15/20 1535 79     Resp 07/15/20 1535 18     Temp 07/15/20 1535 97.9 F (36.6 C)     Temp Source 07/15/20 1535 Oral     SpO2 07/15/20 1535 99 %     Weight --      Height --      Head Circumference --      Peak Flow --      Pain Score 07/15/20 1536 7     Pain Loc --      Pain Edu? --      Excl. in [place]? --    No data found.  Updated Vital Signs BP 120/85 (BP Location: Right Arm)   Pulse 79   Temp 97.9 F (36.6 C) (Oral)   Resp 18   LMP 07/13/2015 (Approximate)   SpO2 99%   Visual Acuity Right Eye Distance:   Left Eye Distance:   Bilateral Distance:    Right Eye Near:   Left Eye Near:    Bilateral Near:     Physical Exam Constitutional:      General: She is not in acute distress.    Appearance: She is well-developed.  HENT:     Right Ear: Tympanic membrane and ear canal normal.     Left Ear: Tympanic membrane and ear canal normal.     Mouth/Throat:     Tonsils: No tonsillar exudate.  Cardiovascular:     Rate and Rhythm: Normal rate.  Pulmonary:     Effort: Pulmonary effort is normal.  Skin:    General: Skin is warm and dry.  Neurological:     Mental Status: She is alert and oriented to person, place, and time.      UC Treatments / Results  Labs (all labs ordered are listed, but only abnormal results are displayed) Labs Reviewed - No data to display  EKG   Radiology No results found.  Procedures Procedures (including critical care time)  Medications Ordered in UC Medications - No data to display  Initial Impression / Assessment and Plan / UC Course  I have reviewed the triage vital signs and the nursing notes.  Pertinent labs & imaging results that were available during my care of the patient were reviewed by me and considered in my medical decision making (see chart for details).     Non toxic.  Benign physical exam.  History and physical consistent with viral illness.  Supportive cares recommended. Return precautions provided. Patient verbalized understanding and agreeable to plan.   Final Clinical Impressions(s) / UC Diagnoses   Final diagnoses:  Viral URI with cough     Discharge Instructions     Push fluids to ensure adequate hydration and keep secretions thin.  Tylenol and/or ibuprofen as needed for pain or fevers.  Throat lozenges, gargles, chloraseptic spray, warm teas, popsicles etc to help with throat pain.   Tessalon as needed for cough, and/or continue with the over the counter medications as needed for symptoms.  Continue with your allergy medications.  Use of atrovent nasal spray 4 times a day as needed.  If symptoms worsen or do not improve in the next week to return to be seen or to follow up with your PCP.      ED Prescriptions    Medication Sig Dispense Auth. Provider   ipratropium (ATROVENT) 0.06 % nasal spray Place 2 sprays into both nostrils 4 (four) times daily as needed for rhinitis. 15 mL Augusto Gamble B, NP   benzonatate (TESSALON) 100 MG capsule Take 1-2 capsules (100-200 mg total) by mouth 3 (three) times daily as needed for cough. 21 capsule Zigmund Gottron, NP     PDMP not reviewed this encounter.   Zigmund Gottron, NP 07/15/20 2225

## 2020-07-15 NOTE — Discharge Instructions (Signed)
Push fluids to ensure adequate hydration and keep secretions thin.  Tylenol and/or ibuprofen as needed for pain or fevers.  Throat lozenges, gargles, chloraseptic spray, warm teas, popsicles etc to help with throat pain.   Tessalon as needed for cough, and/or continue with the over the counter medications as needed for symptoms.  Continue with your allergy medications.  Use of atrovent nasal spray 4 times a day as needed.  If symptoms worsen or do not improve in the next week to return to be seen or to follow up with your PCP.

## 2020-10-22 ENCOUNTER — Ambulatory Visit (INDEPENDENT_AMBULATORY_CARE_PROVIDER_SITE_OTHER): Payer: Managed Care, Other (non HMO)

## 2020-10-22 ENCOUNTER — Ambulatory Visit
Admission: EM | Admit: 2020-10-22 | Discharge: 2020-10-22 | Disposition: A | Discharge status: Home / Self Care | Payer: Managed Care, Other (non HMO) | Attending: Emergency Medicine | Admitting: Emergency Medicine

## 2020-10-22 DIAGNOSIS — M545 Low back pain, unspecified: Secondary | ICD-10-CM

## 2020-10-22 DIAGNOSIS — M5459 Other low back pain: Secondary | ICD-10-CM

## 2020-10-22 MED ORDER — IBUPROFEN 800 MG PO TABS: 800.0000 mg | ORAL_TABLET | Freq: Three times a day (TID) | ORAL | 0 refills | Status: DC | PRN

## 2020-10-22 MED ORDER — CYCLOBENZAPRINE HCL 10 MG PO TABS: 10.0000 mg | ORAL_TABLET | Freq: Two times a day (BID) | ORAL | 0 refills | Status: DC | PRN

## 2020-10-22 NOTE — Discharge Instructions (Addendum)
Take the prescribed ibuprofen as needed for your pain.  Take the muscle relaxer Flexeril as needed for muscle spasm; Do not drive, operate machinery, or drink alcohol with this medication as it may make you drowsy.    Follow up with your primary care provider or an orthopedist if your pain is not improving.
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
## 2020-12-10 ENCOUNTER — Encounter: Payer: Managed Care, Other (non HMO) | Admitting: Internal Medicine

## 2020-12-14 ENCOUNTER — Telehealth: Payer: Self-pay | Admitting: Internal Medicine

## 2020-12-14 NOTE — Telephone Encounter (Signed)
Called patient to change CPE until 02/11/21 at 2:15pm

## 2021-01-15 ENCOUNTER — Encounter: Payer: Managed Care, Other (non HMO) | Admitting: Internal Medicine

## 2021-01-21 ENCOUNTER — Ambulatory Visit
Admission: RE | Admit: 2021-01-21 | Discharge: 2021-01-21 | Disposition: A | Discharge status: Home / Self Care | Payer: Managed Care, Other (non HMO) | Source: Ambulatory Visit | Attending: Family Medicine | Admitting: Family Medicine

## 2021-01-21 ENCOUNTER — Other Ambulatory Visit: Payer: Self-pay

## 2021-01-21 VITALS — BP 128/87 | HR 99 | Temp 98.6°F | Resp 18

## 2021-01-21 DIAGNOSIS — R002 Palpitations: Secondary | ICD-10-CM | POA: Diagnosis not present

## 2021-01-21 MED ORDER — OMEPRAZOLE 20 MG PO CPDR
DELAYED_RELEASE_CAPSULE | ORAL | 2 refills | Status: DC
Start: 2021-01-21 — End: 2022-07-18

## 2021-01-21 NOTE — ED Notes (Signed)
Triaged by provider  

## 2021-01-21 NOTE — Discharge Instructions (Signed)
Nothing concerning on your exam today.  Your EKG was normal We are drawing some basic lab work and checking your thyroid Make sure drinking plenty of fluids and staying hydrated. Recommend restarting your omeprazole daily to see if this helps in case this is GERD related. Follow-up with your primary care as planned

## 2021-01-22 LAB — CBC WITH DIFFERENTIAL/PLATELET
Basophils Absolute: 0.1 10*3/uL (ref 0.0–0.2)
Basos: 2 %
EOS (ABSOLUTE): 0 10*3/uL (ref 0.0–0.4)
Eos: 1 %
Hematocrit: 41.5 % (ref 34.0–46.6)
Hemoglobin: 12.6 g/dL (ref 11.1–15.9)
Immature Grans (Abs): 0 10*3/uL (ref 0.0–0.1)
Immature Granulocytes: 0 %
Lymphocytes Absolute: 2 10*3/uL (ref 0.7–3.1)
Lymphs: 37 %
MCH: 21.6 pg — ABNORMAL LOW (ref 26.6–33.0)
MCHC: 30.4 g/dL — ABNORMAL LOW (ref 31.5–35.7)
MCV: 71 fL — ABNORMAL LOW (ref 79–97)
Monocytes Absolute: 0.4 10*3/uL (ref 0.1–0.9)
Monocytes: 8 %
Neutrophils Absolute: 2.9 10*3/uL (ref 1.4–7.0)
Neutrophils: 52 %
Platelets: 371 10*3/uL (ref 150–450)
RBC: 5.84 x10E6/uL — ABNORMAL HIGH (ref 3.77–5.28)
RDW: 16.5 % — ABNORMAL HIGH (ref 11.7–15.4)
WBC: 5.5 10*3/uL (ref 3.4–10.8)

## 2021-01-22 LAB — COMPREHENSIVE METABOLIC PANEL
ALT: 7 IU/L (ref 0–32)
AST: 16 IU/L (ref 0–40)
Albumin/Globulin Ratio: 1.6 (ref 1.2–2.2)
Albumin: 4.7 g/dL (ref 3.8–4.8)
Alkaline Phosphatase: 56 IU/L (ref 44–121)
BUN/Creatinine Ratio: 15 (ref 9–23)
BUN: 13 mg/dL (ref 6–24)
Bilirubin Total: 0.4 mg/dL (ref 0.0–1.2)
CO2: 18 mmol/L — ABNORMAL LOW (ref 20–29)
Calcium: 9.4 mg/dL (ref 8.7–10.2)
Chloride: 99 mmol/L (ref 96–106)
Creatinine, Ser: 0.85 mg/dL (ref 0.57–1.00)
GFR calc Af Amer: 95 mL/min/{1.73_m2} (ref >59)
GFR calc non Af Amer: 82 mL/min/{1.73_m2} (ref >59)
Globulin, Total: 2.9 g/dL (ref 1.5–4.5)
Glucose: 89 mg/dL (ref 65–99)
Potassium: 4.2 mmol/L (ref 3.5–5.2)
Sodium: 136 mmol/L (ref 134–144)
Total Protein: 7.6 g/dL (ref 6.0–8.5)

## 2021-01-22 LAB — TSH: TSH: 1.98 u[IU]/mL (ref 0.450–4.500)

## 2021-01-22 NOTE — ED Provider Notes (Signed)
[facility]    CSN: [id number] Arrival date & time: 01/21/21  1158      History   Chief Complaint No chief complaint on file.   HPI Latasha Klein is a 45 y.o. female.   Patient is a 45 year old female who presents today with heart palpitations, GERD.  Has been an intermittent issue over the past month and a half.  No associated chest tightness or shortness of breath.  Had episode of GERD on Saturday and took Alka-Seltzer.  Noticed it more prominently when taking Sudafed initially when she had COVID back in December.  No leg pain, swelling.  No history of DVT or PE.  No fever, chills.  Has decreased her caffeine.  Also started keto diet about a month ago which she reports is done before in the past without any issues.  Is otherwise healthy and currently taking allergy medication and medication for excessive sweating which she has been taking for a while.      Past Medical History:  Diagnosis Date  . Abnormal uterine bleeding (AUB)   . Anemia   . Blood dyscrasia    beta thalacemia  . Chicken pox   . GERD (gastroesophageal reflux disease)   . Headache    occasional migraines    Patient Active Problem List   Diagnosis Date Noted  . GERD (gastroesophageal reflux disease) 02/13/2015  . Irregular menses 02/13/2015    Past Surgical History:  Procedure Laterality Date  . ABDOMINAL HYSTERECTOMY N/A 07/30/2015   Procedure: HYSTERECTOMY ABDOMINAL;  Surgeon: Linda Hedges, DO;  Location: [facility];  Service: Gynecology;  Laterality: N/A;  . BILATERAL SALPINGECTOMY Bilateral 07/30/2015   Procedure: BILATERAL SALPINGECTOMY;  Surgeon: Linda Hedges, DO;  Location: [facility];  Service: Gynecology;  Laterality: Bilateral;  . LAPAROSCOPIC ASSISTED VAGINAL HYSTERECTOMY N/A 07/30/2015   Procedure: ATTEMPTED LAPAROSCOPIC ASSISTED VAGINAL HYSTERECTOMY;  Surgeon: Linda Hedges, DO;  Location: [facility];  Service: Gynecology;  Laterality: N/A;  . WISDOM TOOTH EXTRACTION      OB History   No  obstetric history on file.      Home Medications    Prior to Admission medications   Medication Sig Start Date End Date Taking? Authorizing Provider  calcium carbonate (TUMS - DOSED IN MG ELEMENTAL CALCIUM) 500 MG chewable tablet Chew 1 tablet by mouth daily as needed for heartburn.     [provider]  cetirizine (ZYRTEC) 10 MG chewable tablet Chew 10 mg by mouth daily.    [provider]  Cholecalciferol (VITAMIN D) 2000 units CAPS Take 1 capsule by mouth daily.    [provider]  omeprazole (PRILOSEC) 20 MG capsule TAKE 1 CAPSULE (20 MG TOTAL) BY MOUTH DAILY. 01/21/21   Loura Halt A, NP  fluticasone (FLONASE) 50 MCG/ACT nasal spray Place 2 sprays into both nostrils daily. 02/02/18 01/21/21  Jearld Fenton, NP  ipratropium (ATROVENT) 0.06 % nasal spray Place 2 sprays into both nostrils 4 (four) times daily as needed for rhinitis. 07/15/20 01/21/21  Zigmund Gottron, NP    Family History Family History  Problem Relation Age of Onset  . Arthritis Mother   . Arthritis Maternal Aunt   . Cancer Maternal Aunt        Breast  . Arthritis Maternal Uncle   . Arthritis Maternal Grandmother   . Stroke Maternal Grandmother   . Cancer Paternal Grandfather        Prostate    Social History Social History   Tobacco Use  .  Smoking status: Never Smoker  . Smokeless tobacco: Never Used  Substance Use Topics  . Alcohol use: Yes    Alcohol/week: 0.0 standard drinks    Comment: occasional  . Drug use: No     Allergies   Amoxicillin and Latex   Review of Systems Review of Systems   Physical Exam Triage Vital Signs ED Triage Vitals  Enc Vitals Group     BP 01/21/21 1208 128/87     Pulse Rate 01/21/21 1208 99     Resp 01/21/21 1208 18     Temp 01/21/21 1208 98.6 F (37 C)     Temp Source 01/21/21 1208 Oral     SpO2 01/21/21 1208 98 %     Weight --      Height --      Head Circumference --      Peak Flow --      Pain Score 01/21/21 1331 0     Pain  Loc --      Pain Edu? --      Excl. in [place]? --    No data found.  Updated Vital Signs BP 128/87 (BP Location: Left Arm)   Pulse 99   Temp 98.6 F (37 C) (Oral)   Resp 18   LMP 07/13/2015 (Approximate)   SpO2 98%   Visual Acuity Right Eye Distance:   Left Eye Distance:   Bilateral Distance:    Right Eye Near:   Left Eye Near:    Bilateral Near:     Physical Exam Vitals and nursing note reviewed.  Constitutional:      General: She is not in acute distress.    Appearance: Normal appearance. She is not ill-appearing, toxic-appearing or diaphoretic.  HENT:     Head: Normocephalic.  Eyes:     Conjunctiva/sclera: Conjunctivae normal.  Cardiovascular:     Rate and Rhythm: Normal rate and regular rhythm.  Pulmonary:     Effort: Pulmonary effort is normal.     Breath sounds: Normal breath sounds.  Musculoskeletal:        General: Normal range of motion.     Cervical back: Normal range of motion.  Skin:    General: Skin is warm and dry.     Findings: No rash.  Neurological:     Mental Status: She is alert.  Psychiatric:        Mood and Affect: Mood normal.      UC Treatments / Results  Labs (all labs ordered are listed, but only abnormal results are displayed) Labs Reviewed  COMPREHENSIVE METABOLIC PANEL - Abnormal; Notable for the following components:      Result Value   CO2 18 (*)    All other components within normal limits   Narrative:    Performed at:  [address] [place], [place]  [id number] Lab Director: Rush Farmer MD, Phone:  [phone]  CBC WITH DIFFERENTIAL/PLATELET - Abnormal; Notable for the following components:   RBC 5.84 (*)    MCV 71 (*)    MCH 21.6 (*)    MCHC 30.4 (*)    RDW 16.5 (*)    All other components within normal limits   Narrative:    Performed at:  [address] [place], [place]  [id number] Lab Director: Rush Farmer MD, Phone:  [phone]  TSH   Narrative:     Performed at:  [facility] [address] [place], [place]  [id number] Lab  Director: Rush Farmer MD, Phone:  [phone]    EKG   Radiology No results found.  Procedures Procedures (including critical care time)  Medications Ordered in UC Medications - No data to display  Initial Impression / Assessment and Plan / UC Course  I have reviewed the triage vital signs and the nursing notes.  Pertinent labs & imaging results that were available during my care of the patient were reviewed by me and considered in my medical decision making (see chart for details).     Heart palpitations Nothing concerning on exam today. EKG with normal sinus rhythm and normal rate.  We will draw some lab work to include CBC, CMP and TSH. This has been an ongoing issue for almost 2 months.  Nothing acute today.  Feel that some of her symptoms may be associated with GERD.  We will have her restart the omeprazole daily for this. Recommend follow-up with primary care for any continued issues and possible referral to cardiology if needed.  Final Clinical Impressions(s) / UC Diagnoses   Final diagnoses:  Heart palpitations     Discharge Instructions     Nothing concerning on your exam today.  Your EKG was normal We are drawing some basic lab work and checking your thyroid Make sure drinking plenty of fluids and staying hydrated. Recommend restarting your omeprazole daily to see if this helps in case this is GERD related. Follow-up with your primary care as planned    ED Prescriptions    Medication Sig Dispense Auth. Provider   omeprazole (PRILOSEC) 20 MG capsule TAKE 1 CAPSULE (20 MG TOTAL) BY MOUTH DAILY. 30 capsule Skyrah Krupp A, NP     PDMP not reviewed this encounter.   Orvan July, NP 01/22/21 [phone]

## 2021-02-02 ENCOUNTER — Ambulatory Visit
Admission: RE | Admit: 2021-02-02 | Discharge: 2021-02-02 | Disposition: A | Discharge status: Home / Self Care | Payer: Managed Care, Other (non HMO) | Source: Ambulatory Visit

## 2021-02-02 ENCOUNTER — Other Ambulatory Visit: Payer: Self-pay

## 2021-02-02 VITALS — BP 135/98 | HR 104 | Temp 98.0°F | Resp 14 | Ht 66.5 in | Wt 211.0 lb

## 2021-02-02 DIAGNOSIS — R202 Paresthesia of skin: Secondary | ICD-10-CM | POA: Diagnosis not present

## 2021-02-02 DIAGNOSIS — R002 Palpitations: Secondary | ICD-10-CM | POA: Diagnosis not present

## 2021-02-02 DIAGNOSIS — R197 Diarrhea, unspecified: Secondary | ICD-10-CM | POA: Diagnosis not present

## 2021-02-02 LAB — COMPREHENSIVE METABOLIC PANEL
ALT: 12 U/L (ref 0–44)
AST: 14 U/L — ABNORMAL LOW (ref 15–41)
Albumin: 4.1 g/dL (ref 3.5–5.0)
Alkaline Phosphatase: 48 U/L (ref 38–126)
Anion gap: 8 (ref 5–15)
BUN: 14 mg/dL (ref 6–20)
CO2: 25 mmol/L (ref 22–32)
Calcium: 9.1 mg/dL (ref 8.9–10.3)
Chloride: 101 mmol/L (ref 98–111)
Creatinine, Ser: 0.72 mg/dL (ref 0.44–1.00)
GFR, Estimated: >60 mL/min (ref >60)
Glucose, Bld: 84 mg/dL (ref 70–99)
Potassium: 3.9 mmol/L (ref 3.5–5.1)
Sodium: 134 mmol/L — ABNORMAL LOW (ref 135–145)
Total Bilirubin: 0.7 mg/dL (ref 0.3–1.2)
Total Protein: 7.9 g/dL (ref 6.5–8.1)

## 2021-02-02 LAB — CBC WITH DIFFERENTIAL/PLATELET
Abs Immature Granulocytes: 0.02 10*3/uL (ref 0.00–0.07)
Basophils Absolute: 0.1 10*3/uL (ref 0.0–0.1)
Basophils Relative: 1 %
Eosinophils Absolute: 0.1 10*3/uL (ref 0.0–0.5)
Eosinophils Relative: 2 %
HCT: 38.2 % (ref 36.0–46.0)
Hemoglobin: 12 g/dL (ref 12.0–15.0)
Immature Granulocytes: 0 %
Lymphocytes Relative: 32 %
Lymphs Abs: 1.7 10*3/uL (ref 0.7–4.0)
MCH: 21.8 pg — ABNORMAL LOW (ref 26.0–34.0)
MCHC: 31.4 g/dL (ref 30.0–36.0)
MCV: 69.5 fL — ABNORMAL LOW (ref 80.0–100.0)
Monocytes Absolute: 0.5 10*3/uL (ref 0.1–1.0)
Monocytes Relative: 10 %
Neutro Abs: 2.9 10*3/uL (ref 1.7–7.7)
Neutrophils Relative %: 55 %
Platelets: 327 10*3/uL (ref 150–400)
RBC: 5.5 MIL/uL — ABNORMAL HIGH (ref 3.87–5.11)
RDW: 16.5 % — ABNORMAL HIGH (ref 11.5–15.5)
WBC: 5.3 10*3/uL (ref 4.0–10.5)
nRBC: 0 % (ref 0.0–0.2)

## 2021-02-02 NOTE — Discharge Instructions (Addendum)
Please follow up with your PCP and/or Cardiology for further evaluation of your palpitations.  Go to the ER for chest pain, shortness of breath or otherwise worsening.  Small frequent sips of fluids- Pedialyte, Gatorade, water, broth- to maintain hydration.   Bland diet as tolerated.
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
## 2021-02-02 NOTE — ED Triage Notes (Signed)
Patient states that she was diagnosed with COVID at the end of December.  Patient states that here diarrhea has improved.  Patient states that last night it felt like her heart was racing.

## 2021-02-11 ENCOUNTER — Other Ambulatory Visit: Payer: Self-pay

## 2021-02-11 ENCOUNTER — Ambulatory Visit (INDEPENDENT_AMBULATORY_CARE_PROVIDER_SITE_OTHER): Payer: Managed Care, Other (non HMO) | Admitting: Internal Medicine

## 2021-02-11 ENCOUNTER — Encounter: Payer: Self-pay | Admitting: Internal Medicine

## 2021-02-11 VITALS — BP 128/84 | HR 74 | Temp 97.9°F | Ht 65.0 in | Wt 215.0 lb

## 2021-02-11 DIAGNOSIS — K219 Gastro-esophageal reflux disease without esophagitis: Secondary | ICD-10-CM

## 2021-02-11 DIAGNOSIS — Z0000 Encounter for general adult medical examination without abnormal findings: Secondary | ICD-10-CM

## 2021-02-11 DIAGNOSIS — R61 Generalized hyperhidrosis: Secondary | ICD-10-CM

## 2021-02-11 DIAGNOSIS — Z0001 Encounter for general adult medical examination with abnormal findings: Secondary | ICD-10-CM

## 2021-02-11 NOTE — Progress Notes (Addendum)
Subjective:    Patient ID: Latasha Klein, female    DOB: Mar 24, 1976, 45 y.o.   MRN: [id number]  HPI  Pt presents to the clinic today for her annual exam. She is also due to follow up chronic conditions.   GERD: Managed on Omeprazole and Tums as needed. There is no upper GI on file.   Excessive Sweating: Managed on Glycopyrrolate.  Flu: 09/2019 Tetanus: > 10 years ago Covid: [place] x 2 Pap Smear: 2021, [facility] Mammogram: 2021, [facility] Colon Screening: never Vision Screening: annually Dentist: biannually  Diet: She does eat meat. She consumes more veggies than fruits. She tries to avoid fried foods. She drinks mostly water, dt. Soda, unsweet tea, coffee Exercise: Squats, [employer]   Review of Systems      Past Medical History:  Diagnosis Date  . Abnormal uterine bleeding (AUB)   . Anemia   . Blood dyscrasia    beta thalacemia  . Chicken pox   . GERD (gastroesophageal reflux disease)   . Headache    occasional migraines    Current Outpatient Medications  Medication Sig Dispense Refill  . calcium carbonate (TUMS - DOSED IN MG ELEMENTAL CALCIUM) 500 MG chewable tablet Chew 1 tablet by mouth daily as needed for heartburn.     . cetirizine (ZYRTEC) 10 MG chewable tablet Chew 10 mg by mouth daily.    . Cholecalciferol (VITAMIN D) 2000 units CAPS Take 1 capsule by mouth daily.    Marland Kitchen glycopyrrolate (ROBINUL) 1 MG tablet 1 tablet    . omeprazole (PRILOSEC) 20 MG capsule TAKE 1 CAPSULE (20 MG TOTAL) BY MOUTH DAILY. 30 capsule 2   No current facility-administered medications for this visit.    Allergies  Allergen Reactions  . Amoxicillin   . Latex Itching    Pt states itching with latex gloves.    Family History  Problem Relation Age of Onset  . Arthritis Mother   . Arthritis Maternal Aunt   . Cancer Maternal Aunt        Breast  . Arthritis Maternal Uncle   . Arthritis Maternal Grandmother   . Stroke Maternal Grandmother   . Cancer  Paternal Grandfather        Prostate    Social History   Socioeconomic History  . Marital status: Married    Spouse name: Not on file  . Number of children: Not on file  . Years of education: Not on file  . Highest education level: Not on file  Occupational History  . Not on file  Tobacco Use  . Smoking status: Never Smoker  . Smokeless tobacco: Never Used  Vaping Use  . Vaping Use: Never used  Substance and Sexual Activity  . Alcohol use: Yes    Alcohol/week: 0.0 standard drinks    Comment: occasional  . Drug use: No  . Sexual activity: Not on file  Other Topics Concern  . Not on file  Social History Narrative  . Not on file   Social Determinants of Health   Financial Resource Strain: Not on file  Food Insecurity: Not on file  Transportation Needs: Not on file  Physical Activity: Not on file  Stress: Not on file  Social Connections: Not on file  Intimate Partner Violence: Not on file     Constitutional: Denies fever, malaise, fatigue, headache or abrupt weight changes.  HEENT: Denies eye pain, eye redness, ear pain, ringing in the ears, wax buildup, runny nose, nasal congestion, bloody  nose, or sore throat. Respiratory: Denies difficulty breathing, shortness of breath, cough or sputum production.   Cardiovascular: Denies chest pain, chest tightness, palpitations or swelling in the hands or feet.  Gastrointestinal: Pt reports intermittent constipation. Denies abdominal pain, bloating, diarrhea or blood in the stool.  GU: Denies urgency, frequency, pain with urination, burning sensation, blood in urine, odor or discharge. Musculoskeletal: Denies decrease in range of motion, difficulty with gait, muscle pain or joint pain and swelling.  Skin: Pt reports excessive sweating. Denies redness, rashes, lesions or ulcercations.  Neurological: Denies dizziness, difficulty with memory, difficulty with speech or problems with balance and coordination.  Psych: Denies anxiety,  depression, SI/HI.  No other specific complaints in a complete review of systems (except as listed in HPI above).  Objective:   Physical Exam  BP 128/84   Pulse 74   Temp 97.9 F (36.6 C) (Temporal)   Ht _0  (1.651 m)   Wt 215 lb (97.5 kg)   LMP 07/13/2015 (Approximate)   SpO2 98%   BMI 35.78 kg/m   Wt Readings from Last 3 Encounters:  02/02/21 211 lb (95.7 kg)  10/22/20 213 lb (96.6 kg)  12/30/18 223 lb 6.4 oz (101.3 kg)    General: Appears her stated age, obese, in NAD. Skin: Warm, dry and intact. No rashes noted. HEENT: Head: normal shape and size; Eyes: sclera white, no icterus, conjunctiva pink, PERRLA and EOMs intact;  Neck:  Neck supple, trachea midline. No masses, lumps or thyromegaly present.  Cardiovascular: Normal rate and rhythm. S1,S2 noted.  No murmur, rubs or gallops noted. No JVD or BLE edema.  Pulmonary/Chest: Normal effort and positive vesicular breath sounds. No respiratory distress. No wheezes, rales or ronchi noted.  Abdomen: Soft and nontender. Normal bowel sounds. No distention or masses noted. Liver, spleen and kidneys non palpable. Musculoskeletal: Strength 5/5 BUE/BLE. No difficulty with gait.  Neurological: Alert and oriented. Cranial nerves II-XII grossly intact. Coordination normal.  Psychiatric: Mood and affect normal. Behavior is normal. Judgment and thought content normal.    BMET    Component Value Date/Time   NA 134 (L) 02/02/2021 1432   NA 136 01/21/2021 1324   K 3.9 02/02/2021 1432   CL 101 02/02/2021 1432   CO2 25 02/02/2021 1432   GLUCOSE 84 02/02/2021 1432   BUN 14 02/02/2021 1432   BUN 13 01/21/2021 1324   CREATININE 0.72 02/02/2021 1432   CALCIUM 9.1 02/02/2021 1432   GFRNONAA >60 02/02/2021 1432   GFRAA 95 01/21/2021 1324    Lipid Panel     Component Value Date/Time   CHOL 145 02/13/2015 1013   TRIG 51.0 02/13/2015 1013   HDL 57.80 02/13/2015 1013   CHOLHDL 3 02/13/2015 1013   VLDL 10.2 02/13/2015 1013   LDLCALC  77 02/13/2015 1013    CBC    Component Value Date/Time   WBC 5.3 02/02/2021 1432   RBC 5.50 (H) 02/02/2021 1432   HGB 12.0 02/02/2021 1432   HGB 12.6 01/21/2021 1324   HCT 38.2 02/02/2021 1432   HCT 41.5 01/21/2021 1324   PLT 327 02/02/2021 1432   PLT 371 01/21/2021 1324   MCV 69.5 (L) 02/02/2021 1432   MCV 71 (L) 01/21/2021 1324   MCH 21.8 (L) 02/02/2021 1432   MCHC 31.4 02/02/2021 1432   RDW 16.5 (H) 02/02/2021 1432   RDW 16.5 (H) 01/21/2021 1324   LYMPHSABS 1.7 02/02/2021 1432   LYMPHSABS 2.0 01/21/2021 1324   MONOABS 0.5 02/02/2021 1432  EOSABS 0.1 02/02/2021 1432   EOSABS 0.0 01/21/2021 1324   BASOSABS 0.1 02/02/2021 1432   BASOSABS 0.1 01/21/2021 1324    Hgb A1C Lab Results  Component Value Date   HGBA1C 5.9 02/13/2015            Assessment & Plan:   Preventative Health Maintenance:  She declines flu shot today She declines tetanus today She is considering whether or not she will get her COVID booster Pap smear UTD, will request copy Mammogram UTD, will request copy She does not want referral to GI for screening colonoscopy at this time Encouraged her to consume a balanced diet and exercise regimen Advised her to see an eye doctor and dentist annually We will check CBC, c-Met, TSH, lipid, A1c, vitamin D, B12, IBC panel  RTC in 1 year, sooner if needed   Webb Silversmith, NP This visit occurred during the SARS-CoV-2 public health emergency.  Safety protocols were in place, including screening questions prior to the visit, additional usage of staff PPE, and extensive cleaning of exam room while observing appropriate contact time as indicated for disinfecting solutions.

## 2021-02-12 ENCOUNTER — Encounter: Payer: Self-pay | Admitting: Internal Medicine

## 2021-02-12 DIAGNOSIS — R61 Generalized hyperhidrosis: Secondary | ICD-10-CM | POA: Insufficient documentation

## 2021-02-12 LAB — IRON AND TIBC
Iron Saturation: 31 % (ref 15–55)
Iron: 91 ug/dL (ref 27–159)
Total Iron Binding Capacity: 289 ug/dL (ref 250–450)
UIBC: 198 ug/dL (ref 131–425)

## 2021-02-12 LAB — LIPID PANEL
Chol/HDL Ratio: 2.9 ratio (ref 0.0–4.4)
Cholesterol, Total: 205 mg/dL — ABNORMAL HIGH (ref 100–199)
HDL: 71 mg/dL (ref >39)
LDL Chol Calc (NIH): 126 mg/dL — ABNORMAL HIGH (ref 0–99)
Triglycerides: 41 mg/dL (ref 0–149)
VLDL Cholesterol Cal: 8 mg/dL (ref 5–40)

## 2021-02-12 LAB — CBC
Hematocrit: 37 % (ref 34.0–46.6)
Hemoglobin: 11.6 g/dL (ref 11.1–15.9)
MCH: 21.6 pg — ABNORMAL LOW (ref 26.6–33.0)
MCHC: 31.4 g/dL — ABNORMAL LOW (ref 31.5–35.7)
MCV: 69 fL — ABNORMAL LOW (ref 79–97)
Platelets: 328 10*3/uL (ref 150–450)
RBC: 5.36 x10E6/uL — ABNORMAL HIGH (ref 3.77–5.28)
RDW: 16.1 % — ABNORMAL HIGH (ref 11.7–15.4)
WBC: 5.9 10*3/uL (ref 3.4–10.8)

## 2021-02-12 LAB — COMPREHENSIVE METABOLIC PANEL
ALT: 9 IU/L (ref 0–32)
AST: 12 IU/L (ref 0–40)
Albumin/Globulin Ratio: 1.4 (ref 1.2–2.2)
Albumin: 4.3 g/dL (ref 3.8–4.8)
Alkaline Phosphatase: 50 IU/L (ref 44–121)
BUN/Creatinine Ratio: 16 (ref 9–23)
BUN: 11 mg/dL (ref 6–24)
Bilirubin Total: 0.3 mg/dL (ref 0.0–1.2)
CO2: 21 mmol/L (ref 20–29)
Calcium: 9.3 mg/dL (ref 8.7–10.2)
Chloride: 104 mmol/L (ref 96–106)
Creatinine, Ser: 0.67 mg/dL (ref 0.57–1.00)
Globulin, Total: 3 g/dL (ref 1.5–4.5)
Glucose: 79 mg/dL (ref 65–99)
Potassium: 4.3 mmol/L (ref 3.5–5.2)
Sodium: 140 mmol/L (ref 134–144)
Total Protein: 7.3 g/dL (ref 6.0–8.5)
eGFR: 109 mL/min/{1.73_m2} (ref >59)

## 2021-02-12 LAB — HEMOGLOBIN A1C
Est. average glucose Bld gHb Est-mCnc: 105 mg/dL
Hgb A1c MFr Bld: 5.3 % (ref 4.8–5.6)

## 2021-02-12 LAB — VITAMIN D 25 HYDROXY (VIT D DEFICIENCY, FRACTURES): Vit D, 25-Hydroxy: 35.1 ng/mL (ref 30.0–100.0)

## 2021-02-12 LAB — VITAMIN B12: Vitamin B-12: 579 pg/mL (ref 232–1245)

## 2021-02-12 LAB — FOLATE: Folate: 8.9 ng/mL (ref >3.0)

## 2021-02-12 LAB — TRANSFERRIN: Transferrin: 231 mg/dL (ref 192–364)

## 2021-02-12 NOTE — Assessment & Plan Note (Signed)
Continue omeprazole and Tums as needed We will monitor

## 2021-02-12 NOTE — Assessment & Plan Note (Signed)
Continue glycopyrrolate

## 2021-02-12 NOTE — Patient Instructions (Signed)
Health Maintenance, Female Adopting a healthy lifestyle and getting preventive care are important in promoting health and wellness. Ask your health care provider about:  The right schedule for you to have regular tests and exams.  Things you can do on your own to prevent diseases and keep yourself healthy. What should I know about diet, weight, and exercise? Eat a healthy diet  Eat a diet that includes plenty of vegetables, fruits, low-fat dairy products, and lean protein.  Do not eat a lot of foods that are high in solid fats, added sugars, or sodium.   Maintain a healthy weight Body mass index (BMI) is used to identify weight problems. It estimates body fat based on height and weight. Your health care provider can help determine your BMI and help you achieve or maintain a healthy weight. Get regular exercise Get regular exercise. This is one of the most important things you can do for your health. Most adults should:  Exercise for at least 150 minutes each week. The exercise should increase your heart rate and make you sweat (moderate-intensity exercise).  Do strengthening exercises at least twice a week. This is in addition to the moderate-intensity exercise.  Spend less time sitting. Even light physical activity can be beneficial. Watch cholesterol and blood lipids Have your blood tested for lipids and cholesterol at 47 years of age, then have this test every 5 years. Have your cholesterol levels checked more often if:  Your lipid or cholesterol levels are high.  You are older than 47 years of age.  You are at high risk for heart disease. What should I know about cancer screening? Depending on your health history and family history, you may need to have cancer screening at various ages. This may include screening for:  Breast cancer.  Cervical cancer.  Colorectal cancer.  Skin cancer.  Lung cancer. What should I know about heart disease, diabetes, and high blood  pressure? Blood pressure and heart disease  High blood pressure causes heart disease and increases the risk of stroke. This is more likely to develop in people who have high blood pressure readings, are of African descent, or are overweight.  Have your blood pressure checked: ? Every 3-5 years if you are 18-39 years of age. ? Every year if you are 40 years old or older. Diabetes Have regular diabetes screenings. This checks your fasting blood sugar level. Have the screening done:  Once every three years after age 40 if you are at a normal weight and have a low risk for diabetes.  More often and at a younger age if you are overweight or have a high risk for diabetes. What should I know about preventing infection? Hepatitis B If you have a higher risk for hepatitis B, you should be screened for this virus. Talk with your health care provider to find out if you are at risk for hepatitis B infection. Hepatitis C Testing is recommended for:  Everyone born from 1945 through 1965.  Anyone with known risk factors for hepatitis C. Sexually transmitted infections (STIs)  Get screened for STIs, including gonorrhea and chlamydia, if: ? You are sexually active and are younger than 47 years of age. ? You are older than 47 years of age and your health care provider tells you that you are at risk for this type of infection. ? Your sexual activity has changed since you were last screened, and you are at increased risk for chlamydia or gonorrhea. Ask your health care provider   if you are at risk.  Ask your health care provider about whether you are at high risk for HIV. Your health care provider may recommend a prescription medicine to help prevent HIV infection. If you choose to take medicine to prevent HIV, you should first get tested for HIV. You should then be tested every 3 months for as long as you are taking the medicine. Pregnancy  If you are about to stop having your period (premenopausal) and  you may become pregnant, seek counseling before you get pregnant.  Take 400 to 800 micrograms (mcg) of folic acid every day if you become pregnant.  Ask for birth control (contraception) if you want to prevent pregnancy. Osteoporosis and menopause Osteoporosis is a disease in which the bones lose minerals and strength with aging. This can result in bone fractures. If you are 65 years old or older, or if you are at risk for osteoporosis and fractures, ask your health care provider if you should:  Be screened for bone loss.  Take a calcium or vitamin D supplement to lower your risk of fractures.  Be given hormone replacement therapy (HRT) to treat symptoms of menopause. Follow these instructions at home: Lifestyle  Do not use any products that contain nicotine or tobacco, such as cigarettes, e-cigarettes, and chewing tobacco. If you need help quitting, ask your health care provider.  Do not use street drugs.  Do not share needles.  Ask your health care provider for help if you need support or information about quitting drugs. Alcohol use  Do not drink alcohol if: ? Your health care provider tells you not to drink. ? You are pregnant, may be pregnant, or are planning to become pregnant.  If you drink alcohol: ? Limit how much you use to 0-1 drink a day. ? Limit intake if you are breastfeeding.  Be aware of how much alcohol is in your drink. In the [place] one drink equals one 12 oz bottle of beer (355 mL), one 5 oz glass of wine (148 mL), or one 1 oz glass of hard liquor (44 mL). General instructions  Schedule regular health, dental, and eye exams.  Stay current with your vaccines.  Tell your health care provider if: ? You often feel depressed. ? You have ever been abused or do not feel safe at home. Summary  Adopting a healthy lifestyle and getting preventive care are important in promoting health and wellness.  Follow your health care provider's instructions about healthy  diet, exercising, and getting tested or screened for diseases.  Follow your health care provider's instructions on monitoring your cholesterol and blood pressure. This information is not intended to replace advice given to you by your health care provider. Make sure you discuss any questions you have with your health care provider. Document Revised: 11/17/2018 Document Reviewed: 11/17/2018 Elsevier Patient Education  2021 [employer]  

## 2021-02-14 ENCOUNTER — Ambulatory Visit (INDEPENDENT_AMBULATORY_CARE_PROVIDER_SITE_OTHER): Payer: Managed Care, Other (non HMO)

## 2021-02-14 ENCOUNTER — Encounter: Payer: Self-pay | Admitting: Cardiology

## 2021-02-14 ENCOUNTER — Other Ambulatory Visit: Payer: Self-pay

## 2021-02-14 ENCOUNTER — Ambulatory Visit: Payer: Managed Care, Other (non HMO) | Admitting: Cardiology

## 2021-02-14 VITALS — BP 106/70 | HR 96 | Ht 65.0 in | Wt 214.0 lb

## 2021-02-14 DIAGNOSIS — R002 Palpitations: Secondary | ICD-10-CM

## 2021-02-14 DIAGNOSIS — E7800 Pure hypercholesterolemia, unspecified: Secondary | ICD-10-CM

## 2021-02-14 NOTE — Progress Notes (Signed)
Cardiology Office Note:    Date:  02/14/2021   ID:  Latasha Klein, DOB 1975-12-21, MRN [id number]  PCP:  Jearld Fenton, NP   [place]  Cardiologist:  Kate Sable, MD  Advanced Practice Provider:  No care team member to display Electrophysiologist:  None       Referring MD: Jearld Fenton, NP   Chief Complaint  Patient presents with  . New Patient (Initial Visit)    Referred by PCP for palpitations. Symptoms stated after COVID. Patient c.o some SOB on exertion and chest tightness. Meds reviewed verbally with patient.    Latasha Klein is a 45 y.o. female who is being seen today for the evaluation of palpitations at the request of Jearld Fenton, NP.   History of Present Illness:    Latasha Klein is a 45 y.o. female with a hx of GERD, who presents due to palpitations.  She states having symptoms of heart flutters occurring about 2-3 times a week over the past 2 months.  Symptoms began after she had COVID-19 infection December 2021.  She woke up once with palpitations, heart rates of 143.  Had diarrhea at the time.  Exertion makes palpitations/heart rates worse.  Denies any history of heart disease, denies dizziness, edema, smoking.  Past Medical History:  Diagnosis Date  . Abnormal uterine bleeding (AUB)   . Anemia   . Blood dyscrasia    beta thalacemia  . Chicken pox   . GERD (gastroesophageal reflux disease)   . Headache    occasional migraines    Past Surgical History:  Procedure Laterality Date  . ABDOMINAL HYSTERECTOMY N/A 07/30/2015   Procedure: HYSTERECTOMY ABDOMINAL;  Surgeon: Linda Hedges, DO;  Location: [facility];  Service: Gynecology;  Laterality: N/A;  . BILATERAL SALPINGECTOMY Bilateral 07/30/2015   Procedure: BILATERAL SALPINGECTOMY;  Surgeon: Linda Hedges, DO;  Location: [facility];  Service: Gynecology;  Laterality: Bilateral;  . LAPAROSCOPIC ASSISTED VAGINAL HYSTERECTOMY N/A 07/30/2015   Procedure: ATTEMPTED LAPAROSCOPIC  ASSISTED VAGINAL HYSTERECTOMY;  Surgeon: Linda Hedges, DO;  Location: [facility];  Service: Gynecology;  Laterality: N/A;  . WISDOM TOOTH EXTRACTION      Current Medications: Current Meds  Medication Sig  . calcium carbonate (TUMS - DOSED IN MG ELEMENTAL CALCIUM) 500 MG chewable tablet Chew 1 tablet by mouth daily as needed for heartburn.   . Cholecalciferol (VITAMIN D) 2000 units CAPS Take 1 capsule by mouth daily.  Marland Kitchen glycopyrrolate (ROBINUL) 1 MG tablet 1 tablet  . ipratropium (ATROVENT) 0.06 % nasal spray Place 2 sprays into both nostrils 4 (four) times daily.  Marland Kitchen omeprazole (PRILOSEC) 20 MG capsule TAKE 1 CAPSULE (20 MG TOTAL) BY MOUTH DAILY.  . [DISCONTINUED] ipratropium (ATROVENT) 0.06 % nasal spray Place 2 sprays into both nostrils 4 (four) times daily as needed for rhinitis.     Allergies:   Amoxicillin and Latex   Social History   Socioeconomic History  . Marital status: Married    Spouse name: Not on file  . Number of children: Not on file  . Years of education: Not on file  . Highest education level: Not on file  Occupational History  . Not on file  Tobacco Use  . Smoking status: Never Smoker  . Smokeless tobacco: Never Used  Vaping Use  . Vaping Use: Never used  Substance and Sexual Activity  . Alcohol use: Yes    Alcohol/week: 0.0 standard drinks    Comment: occasional  .  Drug use: No  . Sexual activity: Not on file  Other Topics Concern  . Not on file  Social History Narrative  . Not on file   Social Determinants of Health   Financial Resource Strain: Not on file  Food Insecurity: Not on file  Transportation Needs: Not on file  Physical Activity: Not on file  Stress: Not on file  Social Connections: Not on file     Family History: The patient's family history includes Arthritis in her maternal aunt, maternal grandmother, maternal uncle, and mother; Cancer in her maternal aunt and paternal grandfather; Stroke in her maternal grandmother.  ROS:   Please  see the history of present illness.     All other systems reviewed and are negative.  EKGs/Labs/Other Studies Reviewed:    The following studies were reviewed today:   EKG:  EKG is  ordered today.  The ekg ordered today demonstrates normal sinus rhythm, nonspecific T wave changes.  Recent Labs: 01/21/2021: TSH 1.980 02/11/2021: ALT 9; BUN 11; Creatinine, Ser 0.67; Hemoglobin 11.6; Platelets 328; Potassium 4.3; Sodium 140  Recent Lipid Panel    Component Value Date/Time   CHOL 205 (H) 02/11/2021 1445   TRIG 41 02/11/2021 1445   HDL 71 02/11/2021 1445   CHOLHDL 2.9 02/11/2021 1445   CHOLHDL 3 02/13/2015 1013   VLDL 10.2 02/13/2015 1013   LDLCALC 126 (H) 02/11/2021 1445     Risk Assessment/Calculations:      Physical Exam:    VS:  BP 106/70 (BP Location: Right Arm, Patient Position: Sitting, Cuff Size: Normal)   Pulse 96   Ht 5\' 5"  (1.651 m)   Wt 214 lb (97.1 kg)   LMP 07/13/2015 (Approximate)   SpO2 98%   BMI 35.61 kg/m     Wt Readings from Last 3 Encounters:  02/14/21 214 lb (97.1 kg)  02/11/21 215 lb (97.5 kg)  02/02/21 211 lb (95.7 kg)     GEN:  Well nourished, well developed in no acute distress HEENT: Normal NECK: No JVD; No carotid bruits LYMPHATICS: No lymphadenopathy CARDIAC: RRR, no murmurs, rubs, gallops RESPIRATORY:  Clear to auscultation without rales, wheezing or rhonchi  ABDOMEN: Soft, non-tender, non-distended MUSCULOSKELETAL:  No edema; No deformity  SKIN: Warm and dry NEUROLOGIC:  Alert and oriented x 3 PSYCHIATRIC:  Normal affect   ASSESSMENT:    1. Palpitations   2. Pure hypercholesterolemia    PLAN:    In order of problems listed above:  1. Palpitations, obtain 2-week cardiac monitor to evaluate any significant arrhythmias.  Patient has no cardiac risk factors, low risk for cardiac disease.  Unlikely to have significant arrhythmias.  Will evaluate with cardiac monitor, plan to reassure patient if normal. 2. Hyperlipidemia, 10-year  ASCVD risk 0.3%.  Patient not in statin benefit group.  Low-cholesterol diet recommended.   Follow-up after cardiac monitor.   Medication Adjustments/Labs and Tests Ordered: Current medicines are reviewed at length with the patient today.  Concerns regarding medicines are outlined above.  Orders Placed This Encounter  Procedures  . LONG TERM MONITOR (3-14 DAYS)  . EKG 12-Lead   No orders of the defined types were placed in this encounter.   Patient Instructions  Medication Instructions:  Your physician recommends that you continue on your current medications as directed. Please refer to the Current Medication list given to you today. *If you need a refill on your cardiac medications before your next appointment, please call your pharmacy*   Lab Work: None ordered If  you have labs (blood work) drawn today and your tests are completely normal, you will receive your results only by: Marland Kitchen MyChart Message (if you have MyChart) OR . A paper copy in the mail If you have any lab test that is abnormal or we need to change your treatment, we will call you to review the results.   Testing/Procedures:  Your physician has recommended that you wear a Zio monitor for 2 weeks. This monitor is a medical device that records the heart's electrical activity. Doctors most often use these monitors to diagnose arrhythmias. Arrhythmias are problems with the speed or rhythm of the heartbeat. The monitor is a small device applied to your chest. You can wear one while you do your normal daily activities. While wearing this monitor if you have any symptoms to push the button and record what you felt. Once you have worn this monitor for the period of time provider prescribed (Usually 14 days), you will return the monitor device in the postage paid box. Once it is returned they will download the data collected and provide [place] with a report which the provider will then review and we will call you with those results.  Important tips:  1. Avoid showering during the first 24 hours of wearing the monitor. 2. Avoid excessive sweating to help maximize wear time. 3. Do not submerge the device, no hot tubs, and no swimming pools. 4. Keep any lotions or oils away from the patch. 5. After 24 hours you may shower with the patch on. Take brief showers with your back facing the shower head.  6. Do not remove patch once it has been placed because that will interrupt data and decrease adhesive wear time. 7. Push the button when you have any symptoms and write down what you were feeling. 8. Once you have completed wearing your monitor, remove and place into box which has postage paid and place in your outgoing mailbox.  9. If for some reason you have misplaced your box then call our office and we can provide another box and/or mail it off for you.         Follow-Up: At [facility], you and your health needs are our priority.  As part of our continuing mission to provide you with exceptional heart care, we have created designated Provider Care Teams.  These Care Teams include your primary Cardiologist (physician) and Advanced Practice Providers (APPs -  Physician Assistants and Nurse Practitioners) who all work together to provide you with the care you need, when you need it.  We recommend signing up for the patient portal called "MyChart".  Sign up information is provided on this After Visit Summary.  MyChart is used to connect with patients for Virtual Visits (Telemedicine).  Patients are able to view lab/test results, encounter notes, upcoming appointments, etc.  Non-urgent messages can be sent to your provider as well.   To learn more about what you can do with MyChart, go to [email or web address].    Your next appointment:   5 week(s)  The format for your next appointment:   In Person  Provider:   Kate Sable, MD   Other Instructions      Signed, Kate Sable, MD  02/14/2021 12:36 PM     [place]

## 2021-02-14 NOTE — Patient Instructions (Signed)
Medication Instructions:  Your physician recommends that you continue on your current medications as directed. Please refer to the Current Medication list given to you today. *If you need a refill on your cardiac medications before your next appointment, please call your pharmacy*   Lab Work: None ordered If you have labs (blood work) drawn today and your tests are completely normal, you will receive your results only by: Marland Kitchen MyChart Message (if you have MyChart) OR . A paper copy in the mail If you have any lab test that is abnormal or we need to change your treatment, we will call you to review the results.   Testing/Procedures:  Your physician has recommended that you wear a Zio monitor for 2 weeks. This monitor is a medical device that records the heart's electrical activity. Doctors most often use these monitors to diagnose arrhythmias. Arrhythmias are problems with the speed or rhythm of the heartbeat. The monitor is a small device applied to your chest. You can wear one while you do your normal daily activities. While wearing this monitor if you have any symptoms to push the button and record what you felt. Once you have worn this monitor for the period of time provider prescribed (Usually 14 days), you will return the monitor device in the postage paid box. Once it is returned they will download the data collected and provide [place] with a report which the provider will then review and we will call you with those results. Important tips:  1. Avoid showering during the first 24 hours of wearing the monitor. 2. Avoid excessive sweating to help maximize wear time. 3. Do not submerge the device, no hot tubs, and no swimming pools. 4. Keep any lotions or oils away from the patch. 5. After 24 hours you may shower with the patch on. Take brief showers with your back facing the shower head.  6. Do not remove patch once it has been placed because that will interrupt data and decrease adhesive wear  time. 7. Push the button when you have any symptoms and write down what you were feeling. 8. Once you have completed wearing your monitor, remove and place into box which has postage paid and place in your outgoing mailbox.  9. If for some reason you have misplaced your box then call our office and we can provide another box and/or mail it off for you.         Follow-Up: At [facility], you and your health needs are our priority.  As part of our continuing mission to provide you with exceptional heart care, we have created designated Provider Care Teams.  These Care Teams include your primary Cardiologist (physician) and Advanced Practice Providers (APPs -  Physician Assistants and Nurse Practitioners) who all work together to provide you with the care you need, when you need it.  We recommend signing up for the patient portal called "MyChart".  Sign up information is provided on this After Visit Summary.  MyChart is used to connect with patients for Virtual Visits (Telemedicine).  Patients are able to view lab/test results, encounter notes, upcoming appointments, etc.  Non-urgent messages can be sent to your provider as well.   To learn more about what you can do with MyChart, go to [email or web address].    Your next appointment:   5 week(s)  The format for your next appointment:   In Person  Provider:   Kate Sable, MD   Other Instructions

## 2021-03-18 ENCOUNTER — Telehealth: Payer: Self-pay

## 2021-03-18 NOTE — Telephone Encounter (Signed)
Janan Ridge, [place]  03/18/2021 12:35 PM EDT Back to Top     No answer. LMOV. Phone note started.   Kate Sable, MD  03/15/2021 1:12 PM EDT      Benign cardiac monitor, no significant arrhythmias. Patient triggered events associated with sinus rhythm/sinus tachycardia.

## 2021-03-19 NOTE — Telephone Encounter (Signed)
Called and LMOM X2

## 2021-03-22 ENCOUNTER — Ambulatory Visit: Payer: Managed Care, Other (non HMO) | Admitting: Cardiology

## 2021-04-05 ENCOUNTER — Other Ambulatory Visit: Payer: Self-pay

## 2021-04-05 ENCOUNTER — Encounter: Payer: Self-pay | Admitting: Cardiology

## 2021-04-05 ENCOUNTER — Ambulatory Visit: Payer: Managed Care, Other (non HMO) | Admitting: Cardiology

## 2021-04-05 VITALS — BP 104/60 | HR 91 | Ht 66.5 in | Wt 217.0 lb

## 2021-04-05 DIAGNOSIS — E7800 Pure hypercholesterolemia, unspecified: Secondary | ICD-10-CM

## 2021-04-05 DIAGNOSIS — R002 Palpitations: Secondary | ICD-10-CM | POA: Diagnosis not present

## 2021-04-05 NOTE — Patient Instructions (Signed)
Medication Instructions: Your physician recommends that you continue on your current medications as directed. Please refer to the Current Medication list given to you today. *If you need a refill on your cardiac medications before your next appointment, please call your pharmacy*   Lab Work: NONE   If you have labs (blood work) drawn today and your tests are completely normal, you will receive your results only by: Marland Kitchen MyChart Message (if you have MyChart) OR . A paper copy in the mail If you have any lab test that is abnormal or we need to change your treatment, we will call you to review the results.   Testing/Procedures:  NONE    Follow-Up: At [facility], you and your health needs are our priority.  As part of our continuing mission to provide you with exceptional heart care, we have created designated Provider Care Teams.  These Care Teams include your primary Cardiologist (physician) and Advanced Practice Providers (APPs -  Physician Assistants and Nurse Practitioners) who all work together to provide you with the care you need, when you need it.  We recommend signing up for the patient portal called "MyChart".  Sign up information is provided on this After Visit Summary.  MyChart is used to connect with patients for Virtual Visits (Telemedicine).  Patients are able to view lab/test results, encounter notes, upcoming appointments, etc.  Non-urgent messages can be sent to your provider as well.   To learn more about what you can do with MyChart, go to [email or web address].    Your next appointment:  AS NEEDED    Provider:   You may see Kate Sable, MD or one of the following Advanced Practice Providers on your designated Care Team:    Murray Hodgkins, NP  Christell Faith, PA-C  Marrianne Mood, PA-C  Cadence [place]raz[place], [place]  Laurann Montana, NP

## 2021-04-05 NOTE — Progress Notes (Signed)
Cardiology Office Note:    Date:  04/05/2021   ID:  Latasha Klein, DOB 06/13/1976, MRN [id number]  PCP:  Jearld Fenton, NP   [place]  Cardiologist:  Kate Sable, MD  Advanced Practice Provider:  No care team member to display Electrophysiologist:  None       Referring MD: Jearld Fenton, NP   Chief Complaint  Patient presents with  . Other    5 week follow up post ZIO. Meds reviewed vebrally with patient.      History of Present Illness:    Latasha Klein is a 45 y.o. female with a hx of GERD, who presents for follow-up.  She was last seen due to palpitations/heart flutters.  Palpitations are worse with exertion.  Cardiac monitor was placed to evaluate any significant arrhythmias.  She presents for monitor results.  She still has occasional palpitations, denies chest pain.  She is able to exercise on her stationary bike 3 times a week, uses jump rope, lift weights without any symptoms.  Past Medical History:  Diagnosis Date  . Abnormal uterine bleeding (AUB)   . Anemia   . Blood dyscrasia    beta thalacemia  . Chicken pox   . GERD (gastroesophageal reflux disease)   . Headache    occasional migraines    Past Surgical History:  Procedure Laterality Date  . ABDOMINAL HYSTERECTOMY N/A 07/30/2015   Procedure: HYSTERECTOMY ABDOMINAL;  Surgeon: Linda Hedges, DO;  Location: [facility];  Service: Gynecology;  Laterality: N/A;  . BILATERAL SALPINGECTOMY Bilateral 07/30/2015   Procedure: BILATERAL SALPINGECTOMY;  Surgeon: Linda Hedges, DO;  Location: [facility];  Service: Gynecology;  Laterality: Bilateral;  . LAPAROSCOPIC ASSISTED VAGINAL HYSTERECTOMY N/A 07/30/2015   Procedure: ATTEMPTED LAPAROSCOPIC ASSISTED VAGINAL HYSTERECTOMY;  Surgeon: Linda Hedges, DO;  Location: [facility];  Service: Gynecology;  Laterality: N/A;  . WISDOM TOOTH EXTRACTION      Current Medications: Current Meds  Medication Sig  . calcium carbonate (TUMS - DOSED IN MG  ELEMENTAL CALCIUM) 500 MG chewable tablet Chew 1 tablet by mouth daily as needed for heartburn.   . Cholecalciferol (VITAMIN D) 2000 units CAPS Take 1 capsule by mouth daily.  Marland Kitchen glycopyrrolate (ROBINUL) 1 MG tablet 1 tablet  . ipratropium (ATROVENT) 0.06 % nasal spray Place 2 sprays into both nostrils 4 (four) times daily.  Marland Kitchen omeprazole (PRILOSEC) 20 MG capsule TAKE 1 CAPSULE (20 MG TOTAL) BY MOUTH DAILY.     Allergies:   Amoxicillin and Latex   Social History   Socioeconomic History  . Marital status: Married    Spouse name: Not on file  . Number of children: Not on file  . Years of education: Not on file  . Highest education level: Not on file  Occupational History  . Not on file  Tobacco Use  . Smoking status: Never Smoker  . Smokeless tobacco: Never Used  Vaping Use  . Vaping Use: Never used  Substance and Sexual Activity  . Alcohol use: Yes    Alcohol/week: 0.0 standard drinks    Comment: occasional  . Drug use: No  . Sexual activity: Not on file  Other Topics Concern  . Not on file  Social History Narrative  . Not on file   Social Determinants of Health   Financial Resource Strain: Not on file  Food Insecurity: Not on file  Transportation Needs: Not on file  Physical Activity: Not on file  Stress: Not on file  Social Connections: Not on file     Family History: The patient's family history includes Arthritis in her maternal aunt, maternal grandmother, maternal uncle, and mother; Cancer in her maternal aunt and paternal grandfather; Stroke in her maternal grandmother.  ROS:   Please see the history of present illness.     All other systems reviewed and are negative.  EKGs/Labs/Other Studies Reviewed:    The following studies were reviewed today:   EKG:  EKG is  ordered today.  The ekg ordered today demonstrates normal sinus rhythm, nonspecific T wave changes.  Recent Labs: 01/21/2021: TSH 1.980 02/11/2021: ALT 9; BUN 11; Creatinine, Ser 0.67; Hemoglobin  11.6; Platelets 328; Potassium 4.3; Sodium 140  Recent Lipid Panel    Component Value Date/Time   CHOL 205 (H) 02/11/2021 1445   TRIG 41 02/11/2021 1445   HDL 71 02/11/2021 1445   CHOLHDL 2.9 02/11/2021 1445   CHOLHDL 3 02/13/2015 1013   VLDL 10.2 02/13/2015 1013   LDLCALC 126 (H) 02/11/2021 1445     Risk Assessment/Calculations:      Physical Exam:    VS:  BP 104/60 (BP Location: Left Arm, Patient Position: Sitting, Cuff Size: Normal)   Pulse 91   Ht 5' 6.5" (1.689 m)   Wt 217 lb (98.4 kg)   LMP 07/13/2015 (Approximate)   SpO2 99%   BMI 34.50 kg/m     Wt Readings from Last 3 Encounters:  04/05/21 217 lb (98.4 kg)  02/14/21 214 lb (97.1 kg)  02/11/21 215 lb (97.5 kg)     GEN:  Well nourished, well developed in no acute distress HEENT: Normal NECK: No JVD; No carotid bruits LYMPHATICS: No lymphadenopathy CARDIAC: RRR, no murmurs, rubs, gallops RESPIRATORY:  Clear to auscultation without rales, wheezing or rhonchi  ABDOMEN: Soft, non-tender, non-distended MUSCULOSKELETAL:  No edema; No deformity  SKIN: Warm and dry NEUROLOGIC:  Alert and oriented x 3 PSYCHIATRIC:  Normal affect   ASSESSMENT:    1. Palpitations   2. Pure hypercholesterolemia    PLAN:    In order of problems listed above:  1. Palpitations, 2-week cardiac monitor with no significant arrhythmias, patient triggered events associated with sinus rhythm/tachycardia.  She is a low cardiac risk patient.  Patient made aware of results, reassured. 2. Hyperlipidemia, diet modification, low-cholesterol diet advised.   Follow-up as needed   Medication Adjustments/Labs and Tests Ordered: Current medicines are reviewed at length with the patient today.  Concerns regarding medicines are outlined above.  No orders of the defined types were placed in this encounter.  No orders of the defined types were placed in this encounter.   Patient Instructions  Medication Instructions: Your physician recommends  that you continue on your current medications as directed. Please refer to the Current Medication list given to you today. *If you need a refill on your cardiac medications before your next appointment, please call your pharmacy*   Lab Work: NONE   If you have labs (blood work) drawn today and your tests are completely normal, you will receive your results only by: Marland Kitchen MyChart Message (if you have MyChart) OR . A paper copy in the mail If you have any lab test that is abnormal or we need to change your treatment, we will call you to review the results.   Testing/Procedures:  NONE    Follow-Up: At [facility], you and your health needs are our priority.  As part of our continuing mission to provide you with exceptional heart care, we  have created designated Provider Care Teams.  These Care Teams include your primary Cardiologist (physician) and Advanced Practice Providers (APPs -  Physician Assistants and Nurse Practitioners) who all work together to provide you with the care you need, when you need it.  We recommend signing up for the patient portal called "MyChart".  Sign up information is provided on this After Visit Summary.  MyChart is used to connect with patients for Virtual Visits (Telemedicine).  Patients are able to view lab/test results, encounter notes, upcoming appointments, etc.  Non-urgent messages can be sent to your provider as well.   To learn more about what you can do with MyChart, go to [email or web address].    Your next appointment:  AS NEEDED    Provider:   You may see Kate Sable, MD or one of the following Advanced Practice Providers on your designated Care Team:    Murray Hodgkins, NP  Christell Faith, PA-C  Marrianne Mood, PA-C  Cadence [place], [place]  Laurann Montana, NP        Signed, Kate Sable, MD  04/05/2021 12:29 PM    [place]

## 2021-07-24 ENCOUNTER — Other Ambulatory Visit: Payer: Self-pay

## 2021-07-24 ENCOUNTER — Ambulatory Visit
Admission: RE | Admit: 2021-07-24 | Discharge: 2021-07-24 | Disposition: A | Discharge status: Home / Self Care | Payer: Managed Care, Other (non HMO) | Source: Ambulatory Visit | Attending: Emergency Medicine | Admitting: Emergency Medicine

## 2021-07-24 VITALS — BP 123/86 | HR 81 | Temp 98.9°F | Resp 18

## 2021-07-24 DIAGNOSIS — Z79899 Other long term (current) drug therapy: Secondary | ICD-10-CM | POA: Diagnosis not present

## 2021-07-24 DIAGNOSIS — J069 Acute upper respiratory infection, unspecified: Secondary | ICD-10-CM

## 2021-07-24 DIAGNOSIS — Z880 Allergy status to penicillin: Secondary | ICD-10-CM | POA: Insufficient documentation

## 2021-07-24 DIAGNOSIS — Z20822 Contact with and (suspected) exposure to covid-19: Secondary | ICD-10-CM | POA: Diagnosis not present

## 2021-07-24 DIAGNOSIS — J029 Acute pharyngitis, unspecified: Secondary | ICD-10-CM | POA: Diagnosis not present

## 2021-07-24 LAB — SARS CORONAVIRUS 2 (TAT 6-24 HRS): SARS Coronavirus 2: NEGATIVE

## 2021-07-24 LAB — GROUP A STREP BY PCR: Group A Strep by PCR: NOT DETECTED

## 2021-07-24 MED ORDER — IPRATROPIUM BROMIDE 0.06 % NA SOLN: 2.0000 | Freq: Four times a day (QID) | NASAL | 12 refills | Status: AC

## 2021-07-24 MED ORDER — LIDOCAINE VISCOUS HCL 2 % MT SOLN: 15.0000 mL | OROMUCOSAL | 0 refills | Status: DC | PRN

## 2021-07-24 NOTE — ED Triage Notes (Signed)
Pt presents today with c/o of nasal congestion, sore throat and sinus pressure that began yesterday. Denies fever. Home Covid test today, negative. Requests PCR test.

## 2021-07-24 NOTE — ED Provider Notes (Signed)
[facility]    CSN: [id number] Arrival date & time: 07/24/21  1045      History   Chief Complaint Chief Complaint  Patient presents with   Sore Throat   Nasal Congestion   Facial Pain    HPI Latasha Klein is a 45 y.o. female.   HPI  45 year old female here for evaluation of respiratory complaints.  Patient reports that she has been experiencing nasal congestion with sinus pressure and a sore throat that started yesterday.  She does have a clear nasal discharge that is intermittent and she is also experiencing ear pressure.  She reports that she has been experiencing ear pressure since she caught COVID in December and has been constant.  She does have an appointment to see ENT in September to evaluate that.  She denies fever, cough, shortness of breath or wheezing, GI complaints, or body aches.  Patient does work for [employer] and she is requesting a COVID PCR test.  She did take a home test today and it was negative.  Past Medical History:  Diagnosis Date   Abnormal uterine bleeding (AUB)    Anemia    Blood dyscrasia    beta thalacemia   Chicken pox    GERD (gastroesophageal reflux disease)    Headache    occasional migraines    Patient Active Problem List   Diagnosis Date Noted   Excessive sweating 02/12/2021   GERD (gastroesophageal reflux disease) 02/13/2015    Past Surgical History:  Procedure Laterality Date   ABDOMINAL HYSTERECTOMY N/A 07/30/2015   Procedure: HYSTERECTOMY ABDOMINAL;  Surgeon: Linda Hedges, DO;  Location: [facility];  Service: Gynecology;  Laterality: N/A;   BILATERAL SALPINGECTOMY Bilateral 07/30/2015   Procedure: BILATERAL SALPINGECTOMY;  Surgeon: Linda Hedges, DO;  Location: [facility];  Service: Gynecology;  Laterality: Bilateral;   LAPAROSCOPIC ASSISTED VAGINAL HYSTERECTOMY N/A 07/30/2015   Procedure: ATTEMPTED LAPAROSCOPIC ASSISTED VAGINAL HYSTERECTOMY;  Surgeon: Linda Hedges, DO;  Location: [facility];  Service: Gynecology;  Laterality:  N/A;   WISDOM TOOTH EXTRACTION      OB History   No obstetric history on file.      Home Medications    Prior to Admission medications   Medication Sig Start Date End Date Taking? Authorizing Provider  ipratropium (ATROVENT) 0.06 % nasal spray Place 2 sprays into both nostrils 4 (four) times daily. 07/24/21  Yes Margarette Canada, NP  lidocaine (XYLOCAINE) 2 % solution Use as directed 15 mLs in the mouth or throat as needed for mouth pain. 07/24/21  Yes Margarette Canada, NP  calcium carbonate (TUMS - DOSED IN MG ELEMENTAL CALCIUM) 500 MG chewable tablet Chew 1 tablet by mouth daily as needed for heartburn.     [provider]  Cholecalciferol (VITAMIN D) 2000 units CAPS Take 1 capsule by mouth daily.    [provider]  desloratadine (CLARINEX) 5 MG tablet Take 5 mg by mouth daily. 03/06/21   [provider]  glycopyrrolate (ROBINUL) 1 MG tablet 1 tablet    [provider]  omeprazole (PRILOSEC) 20 MG capsule TAKE 1 CAPSULE (20 MG TOTAL) BY MOUTH DAILY. 01/21/21   Loura Halt A, NP  fluticasone (FLONASE) 50 MCG/ACT nasal spray Place 2 sprays into both nostrils daily. 02/02/18 01/21/21  Jearld Fenton, NP    Family History Family History  Problem Relation Age of Onset   Arthritis Mother    Arthritis Maternal Aunt    Cancer Maternal Aunt  Breast   Arthritis Maternal Uncle    Arthritis Maternal Grandmother    Stroke Maternal Grandmother    Cancer Paternal Grandfather        Prostate    Social History Social History   Tobacco Use   Smoking status: Never   Smokeless tobacco: Never  Vaping Use   Vaping Use: Never used  Substance Use Topics   Alcohol use: Yes    Alcohol/week: 0.0 standard drinks    Comment: occasional   Drug use: No     Allergies   Amoxicillin and Latex   Review of Systems Review of Systems  Constitutional:  Negative for activity change, appetite change and fever.  HENT:  Positive for congestion, ear pain, rhinorrhea,  sinus pressure and sore throat. Negative for ear discharge.   Respiratory:  Negative for cough, shortness of breath and wheezing.   Gastrointestinal:  Negative for diarrhea, nausea and vomiting.  Musculoskeletal:  Negative for arthralgias and myalgias.  Hematological: Negative.   Psychiatric/Behavioral: Negative.      Physical Exam Triage Vital Signs ED Triage Vitals  Enc Vitals Group     BP 07/24/21 1101 123/86     Pulse Rate 07/24/21 1101 81     Resp 07/24/21 1101 18     Temp 07/24/21 1101 98.9 F (37.2 C)     Temp Source 07/24/21 1101 Oral     SpO2 07/24/21 1101 98 %     Weight --      Height --      Head Circumference --      Peak Flow --      Pain Score 07/24/21 1059 5     Pain Loc --      Pain Edu? --      Excl. in [place]? --    No data found.  Updated Vital Signs BP 123/86 (BP Location: Right Arm)   Pulse 81   Temp 98.9 F (37.2 C) (Oral)   Resp 18   LMP 07/13/2015 (Approximate)   SpO2 98%   Visual Acuity Right Eye Distance:   Left Eye Distance:   Bilateral Distance:    Right Eye Near:   Left Eye Near:    Bilateral Near:     Physical Exam Vitals and nursing note reviewed.  Constitutional:      General: She is not in acute distress.    Appearance: Normal appearance. She is not ill-appearing.  HENT:     Head: Normocephalic and atraumatic.     Right Ear: Tympanic membrane, ear canal and external ear normal. There is no impacted cerumen.     Left Ear: Tympanic membrane, ear canal and external ear normal. There is no impacted cerumen.     Nose: Congestion and rhinorrhea present.     Mouth/Throat:     Mouth: Mucous membranes are moist.     Pharynx: Oropharynx is clear. Posterior oropharyngeal erythema present.  Cardiovascular:     Rate and Rhythm: Normal rate and regular rhythm.     Pulses: Normal pulses.     Heart sounds: Normal heart sounds. No murmur heard.   No gallop.  Pulmonary:     Effort: Pulmonary effort is normal.     Breath sounds: Normal  breath sounds. No wheezing, rhonchi or rales.  Musculoskeletal:     Cervical back: Normal range of motion and neck supple.  Lymphadenopathy:     Cervical: No cervical adenopathy.  Skin:    General: Skin is warm and dry.  Capillary Refill: Capillary refill takes less than 2 seconds.     Findings: No erythema or rash.  Neurological:     General: No focal deficit present.     Mental Status: She is alert and oriented to person, place, and time.  Psychiatric:        Mood and Affect: Mood normal.        Behavior: Behavior normal.        Thought Content: Thought content normal.        Judgment: Judgment normal.     UC Treatments / Results  Labs (all labs ordered are listed, but only abnormal results are displayed) Labs Reviewed  SARS CORONAVIRUS 2 (TAT 6-24 HRS)  GROUP A STREP BY PCR    EKG   Radiology No results found.  Procedures Procedures (including critical care time)  Medications Ordered in UC Medications - No data to display  Initial Impression / Assessment and Plan / UC Course  I have reviewed the triage vital signs and the nursing notes.  Pertinent labs & imaging results that were available during my care of the patient were reviewed by me and considered in my medical decision making (see chart for details).  Patient is a very pleasant, nontoxic-appearing 45 year old female here for evaluation of respiratory complaints as outlined in HPI above.  Patient's physical exam reveals pearly gray tympanic membranes bilaterally with a normal light reflex and clear external auditory canals.  Nasal mucosa is erythematous and edematous with scant clear nasal discharge.  Oropharyngeal exam reveals benign tonsillar pillars.  Posterior oropharynx does have mild erythema and cobblestoning with clear postnasal drip.  No cervical lymphadenopathy appreciated on exam.  Cardiopulmonary dam reveals clear lung sounds in all fields.  Patient exam is consistent with a viral URI.  She is not  having a cough at this time so will hold on Tessalon Perles and Promethazine DM cough syrup.  Will prescribe Atrovent nasal spray to help with nasal congestion and postnasal drip as well as viscous lidocaine to help with sore throat pain.  COVID PCR was collected as well as strep PCR patient's oropharyngeal exam does not reveal any tonsillar erythema, edema, or exudate and I do not believe that this is strep.  I have advised the patient that if she is positive I will call her and prescribe antibiotics but in the meantime to use the viscous lidocaine to help with pain as well as Sucrets or Chloraseptic lozenges, 1 lozenge every 2 hours, but not more frequently as the menthol may cause diarrhea.  Work note provided.  Advised patient that if he turns a positive for COVID we will prescribe the oral antivirals on her history of blood dyscrasias, anemia, and obesity.   Final Clinical Impressions(s) / UC Diagnoses   Final diagnoses:  Upper respiratory tract infection, unspecified type     Discharge Instructions      Isolate at home pending the results of your COVID test.  If you test positive then you will have to quarantine for 5 days from the start of your symptoms.  After 5 days you can break quarantine if your symptoms have improved and you have not had a fever for 24 hours without taking Tylenol or ibuprofen.  Use over-the-counter Tylenol and ibuprofen as needed for body aches and fever.  Use the Atrovent nasal spray, 2 squirts in each nostril every 6 hours as needed for nasal congestion.  Use the viscous lidocaine, 15 mL as needed for throat pain. You can  also use chlorospetic or succrets lozenges. No more that 1 lozenge every 2 hours as the menthol can cause diarrhea.   If you develop any increased shortness of breath-especially at rest, you are unable to speak in full sentences, or is a late sign your lips are turning blue you need to go the ER for evaluation.      ED Prescriptions      Medication Sig Dispense Auth. Provider   ipratropium (ATROVENT) 0.06 % nasal spray Place 2 sprays into both nostrils 4 (four) times daily. 15 mL Margarette Canada, NP   lidocaine (XYLOCAINE) 2 % solution Use as directed 15 mLs in the mouth or throat as needed for mouth pain. 100 mL Margarette Canada, NP      PDMP not reviewed this encounter.   Margarette Canada, NP 07/24/21 1127

## 2021-07-24 NOTE — Discharge Instructions (Addendum)
Isolate at home pending the results of your COVID test.  If you test positive then you will have to quarantine for 5 days from the start of your symptoms.  After 5 days you can break quarantine if your symptoms have improved and you have not had a fever for 24 hours without taking Tylenol or ibuprofen.  Use over-the-counter Tylenol and ibuprofen as needed for body aches and fever.  Use the Atrovent nasal spray, 2 squirts in each nostril every 6 hours as needed for nasal congestion.  Use the viscous lidocaine, 15 mL as needed for throat pain. You can also use chlorospetic or succrets lozenges. No more that 1 lozenge every 2 hours as the menthol can cause diarrhea.   If you develop any increased shortness of breath-especially at rest, you are unable to speak in full sentences, or is a late sign your lips are turning blue you need to go the ER for evaluation.

## 2021-08-05 ENCOUNTER — Telehealth: Payer: Managed Care, Other (non HMO) | Admitting: Physician Assistant

## 2021-08-05 DIAGNOSIS — B9689 Other specified bacterial agents as the cause of diseases classified elsewhere: Secondary | ICD-10-CM | POA: Diagnosis not present

## 2021-08-05 DIAGNOSIS — J019 Acute sinusitis, unspecified: Secondary | ICD-10-CM

## 2021-08-05 MED ORDER — AZITHROMYCIN 250 MG PO TABS: ORAL_TABLET | ORAL | 0 refills | Status: AC

## 2021-08-05 NOTE — Patient Instructions (Signed)
Latasha Klein, thank you for joining Leeanne Rio, PA-C for today's virtual visit.  While this provider is not your primary care provider (PCP), if your PCP is located in our provider database this encounter information will be shared with them immediately following your visit.  Consent: (Patient) Latasha Klein provided verbal consent for this virtual visit at the beginning of the encounter.  Current Medications:  Current Outpatient Medications:    calcium carbonate (TUMS - DOSED IN MG ELEMENTAL CALCIUM) 500 MG chewable tablet, Chew 1 tablet by mouth daily as needed for heartburn. , Disp: , Rfl:    Cholecalciferol (VITAMIN D) 2000 units CAPS, Take 1 capsule by mouth daily., Disp: , Rfl:    desloratadine (CLARINEX) 5 MG tablet, Take 5 mg by mouth daily., Disp: , Rfl:    glycopyrrolate (ROBINUL) 1 MG tablet, 1 tablet, Disp: , Rfl:    ipratropium (ATROVENT) 0.06 % nasal spray, Place 2 sprays into both nostrils 4 (four) times daily., Disp: 15 mL, Rfl: 12   lidocaine (XYLOCAINE) 2 % solution, Use as directed 15 mLs in the mouth or throat as needed for mouth pain., Disp: 100 mL, Rfl: 0   omeprazole (PRILOSEC) 20 MG capsule, TAKE 1 CAPSULE (20 MG TOTAL) BY MOUTH DAILY., Disp: 30 capsule, Rfl: 2   Medications ordered in this encounter:  No orders of the defined types were placed in this encounter.    *If you need refills on other medications prior to your next appointment, please contact your pharmacy*  Follow-Up: Call back or seek an in-person evaluation if the symptoms worsen or if the condition fails to improve as anticipated.  Other Instructions Please take antibiotic as directed.  Increase fluid intake.  Use Saline nasal spray.  Take a daily multivitamin. Continue your nasal spray. Consider starting Claritin-D in place of your regular antihistamine.  Place a humidifier in the bedroom.  Please call or return clinic if symptoms are not improving.  Sinusitis Sinusitis is redness,  soreness, and swelling (inflammation) of the paranasal sinuses. Paranasal sinuses are air pockets within the bones of your face (beneath the eyes, the middle of the forehead, or above the eyes). In healthy paranasal sinuses, mucus is able to drain out, and air is able to circulate through them by way of your nose. However, when your paranasal sinuses are inflamed, mucus and air can become trapped. This can allow bacteria and other germs to grow and cause infection. Sinusitis can develop quickly and last only a short time (acute) or continue over a long period (chronic). Sinusitis that lasts for more than 12 weeks is considered chronic.  CAUSES  Causes of sinusitis include: Allergies. Structural abnormalities, such as displacement of the cartilage that separates your nostrils (deviated septum), which can decrease the air flow through your nose and sinuses and affect sinus drainage. Functional abnormalities, such as when the small hairs (cilia) that line your sinuses and help remove mucus do not work properly or are not present. SYMPTOMS  Symptoms of acute and chronic sinusitis are the same. The primary symptoms are pain and pressure around the affected sinuses. Other symptoms include: Upper toothache. Earache. Headache. Bad breath. Decreased sense of smell and taste. A cough, which worsens when you are lying flat. Fatigue. Fever. Thick drainage from your nose, which often is green and may contain pus (purulent). Swelling and warmth over the affected sinuses. DIAGNOSIS  Your caregiver will perform a physical exam. During the exam, your caregiver may: Look in your nose  for signs of abnormal growths in your nostrils (nasal polyps). Tap over the affected sinus to check for signs of infection. View the inside of your sinuses (endoscopy) with a special imaging device with a light attached (endoscope), which is inserted into your sinuses. If your caregiver suspects that you have chronic sinusitis,  one or more of the following tests may be recommended: Allergy tests. Nasal culture A sample of mucus is taken from your nose and sent to a lab and screened for bacteria. Nasal cytology A sample of mucus is taken from your nose and examined by your caregiver to determine if your sinusitis is related to an allergy. TREATMENT  Most cases of acute sinusitis are related to a viral infection and will resolve on their own within 10 days. Sometimes medicines are prescribed to help relieve symptoms (pain medicine, decongestants, nasal steroid sprays, or saline sprays).  However, for sinusitis related to a bacterial infection, your caregiver will prescribe antibiotic medicines. These are medicines that will help kill the bacteria causing the infection.  Rarely, sinusitis is caused by a fungal infection. In theses cases, your caregiver will prescribe antifungal medicine. For some cases of chronic sinusitis, surgery is needed. Generally, these are cases in which sinusitis recurs more than 3 times per year, despite other treatments. HOME CARE INSTRUCTIONS  Drink plenty of water. Water helps thin the mucus so your sinuses can drain more easily. Use a humidifier. Inhale steam 3 to 4 times a day (for example, sit in the bathroom with the shower running). Apply a warm, moist washcloth to your face 3 to 4 times a day, or as directed by your caregiver. Use saline nasal sprays to help moisten and clean your sinuses. Take over-the-counter or prescription medicines for pain, discomfort, or fever only as directed by your caregiver. SEEK IMMEDIATE MEDICAL CARE IF: You have increasing pain or severe headaches. You have nausea, vomiting, or drowsiness. You have swelling around your face. You have vision problems. You have a stiff neck. You have difficulty breathing. MAKE SURE YOU:  Understand these instructions. Will watch your condition. Will get help right away if you are not doing well or get worse. Document  Released: 11/24/2005 Document Revised: 02/16/2012 Document Reviewed: 12/09/2011 [facility] Patient Information 2014 [place], [place].    If you have been instructed to have an in-person evaluation today at a local Urgent Care facility, please use the link below. It will take you to a list of all of our available [facility], including address, phone number and hours of operation. Please do not delay care.  [facility]  If you or a family member do not have a primary care provider, use the link below to schedule a visit and establish care. When you choose a [facility] primary care physician or advanced practice provider, you gain a long-term partner in health. Find a Primary Care Provider  Learn more about [facility]'s in-office and virtual care options: [facility]

## 2021-08-05 NOTE — Progress Notes (Signed)
Virtual Visit Consent   Latasha Klein, you are scheduled for a virtual visit with a [facility] provider today.     Just as with appointments in the office, your consent must be obtained to participate.  Your consent will be active for this visit and any virtual visit you may have with one of our providers in the next 365 days.     If you have a MyChart account, a copy of this consent can be sent to you electronically.  All virtual visits are billed to your insurance company just like a traditional visit in the office.    As this is a virtual visit, video technology does not allow for your provider to perform a traditional examination.  This may limit your provider's ability to fully assess your condition.  If your provider identifies any concerns that need to be evaluated in person or the need to arrange testing (such as labs, EKG, etc.), we will make arrangements to do so.     Although advances in technology are sophisticated, we cannot ensure that it will always work on either your end or our end.  If the connection with a video visit is poor, the visit may have to be switched to a telephone visit.  With either a video or telephone visit, we are not always able to ensure that we have a secure connection.     I need to obtain your verbal consent now.   Are you willing to proceed with your visit today?    Latasha Klein has provided verbal consent on 08/05/2021 for a virtual visit (video or telephone).   Leeanne Rio, [place]   Date: 08/05/2021 3:55 PM   Virtual Visit via Video Note   I, Leeanne Rio, connected with  Latasha Klein  ([id number], January 22, 1976) on 08/05/21 at  3:45 PM EDT by a video-enabled telemedicine application and verified that I am speaking with the correct person using two identifiers.  Location: Patient: Virtual Visit Location Patient: Home Provider: Virtual Visit Location Provider: Home Office   I discussed the limitations of evaluation and management  by telemedicine and the availability of in person appointments. The patient expressed understanding and agreed to proceed.    History of Present Illness: Latasha Klein is a 45 y.o. who identifies as a female who was assigned female at birth, and is being seen today for ongoing sinus symptoms. Notes symptoms starting last week with just feeling under the weather, nasal congestion and a cough with sore throat. Was evaluated at urgent care with negative strep and COVID PCR testing. Was started on nasal steroids and supportive measures. Denies any lingering sore throat or significant fatigue. Still with sinus pressure and congestion worsening Saturday now with headache and frontal sinus pain. Denies any chest congestion, chest pain or SOB. She is supposed to head to the beach at the end of the week and wanted to speak with a provider about worsening symptoms.  HPI: HPI  Problems:  Patient Active Problem List   Diagnosis Date Noted   Excessive sweating 02/12/2021   GERD (gastroesophageal reflux disease) 02/13/2015    Allergies:  Allergies  Allergen Reactions   Amoxicillin    Latex Itching    Pt states itching with latex gloves.   Medications:  Current Outpatient Medications:    azithromycin (ZITHROMAX) 250 MG tablet, Take 2 tablets on day 1, then 1 tablet daily on days 2 through 5, Disp: 6 tablet, Rfl: 0   calcium  carbonate (TUMS - DOSED IN MG ELEMENTAL CALCIUM) 500 MG chewable tablet, Chew 1 tablet by mouth daily as needed for heartburn. , Disp: , Rfl:    Cholecalciferol (VITAMIN D) 2000 units CAPS, Take 1 capsule by mouth daily., Disp: , Rfl:    desloratadine (CLARINEX) 5 MG tablet, Take 5 mg by mouth daily., Disp: , Rfl:    glycopyrrolate (ROBINUL) 1 MG tablet, 1 tablet, Disp: , Rfl:    ipratropium (ATROVENT) 0.06 % nasal spray, Place 2 sprays into both nostrils 4 (four) times daily., Disp: 15 mL, Rfl: 12   lidocaine (XYLOCAINE) 2 % solution, Use as directed 15 mLs in the mouth or throat  as needed for mouth pain., Disp: 100 mL, Rfl: 0   omeprazole (PRILOSEC) 20 MG capsule, TAKE 1 CAPSULE (20 MG TOTAL) BY MOUTH DAILY., Disp: 30 capsule, Rfl: 2  Observations/Objective: Patient is well-developed, well-nourished in no acute distress.  Resting comfortably at home.  Head is normocephalic, atraumatic.  No labored breathing. Speech is clear and coherent with logical content.  Patient is alert and oriented at baseline.   Assessment and Plan: 1. Acute bacterial sinusitis - azithromycin (ZITHROMAX) 250 MG tablet; Take 2 tablets on day 1, then 1 tablet daily on days 2 through 5  Dispense: 6 tablet; Refill: 0 Symptoms consistent with sinusitis. Negative COVID. Giving initial improvement followed by worsening, concern  for bacterial etiology. Supportive measures and OTC medications reviewed. Will Rx Azithromycin giving allergies and intolerances, as well as wanting to avoid doxycycline due to trip to the beach and sun sensitivity risks. Follow-up with PCP if symptoms are not resolving.   Follow Up Instructions: I discussed the assessment and treatment plan with the patient. The patient was provided an opportunity to ask questions and all were answered. The patient agreed with the plan and demonstrated an understanding of the instructions.  A copy of instructions were sent to the patient via MyChart.  The patient was advised to call back or seek an in-person evaluation if the symptoms worsen or if the condition fails to improve as anticipated.  Time:  I spent 12 minutes with the patient via telehealth technology discussing the above problems/concerns.    Leeanne Rio, PA-C

## 2021-09-27 ENCOUNTER — Ambulatory Visit: Admit: 2021-09-27 | Discharge status: Against Medical Advice | Payer: Managed Care, Other (non HMO)

## 2022-03-26 ENCOUNTER — Ambulatory Visit (LOCAL_COMMUNITY_HEALTH_CENTER): Payer: Managed Care, Other (non HMO)

## 2022-03-26 DIAGNOSIS — Z719 Counseling, unspecified: Secondary | ICD-10-CM

## 2022-03-26 DIAGNOSIS — Z23 Encounter for immunization: Secondary | ICD-10-CM

## 2022-03-26 NOTE — Progress Notes (Signed)
Fluzone administered in lt deltoid.  Information sheet provided. [facility] updated and 2 copies provided to patient. ?

## 2022-05-15 ENCOUNTER — Other Ambulatory Visit: Payer: Self-pay | Admitting: Nurse Practitioner

## 2022-05-15 DIAGNOSIS — R208 Other disturbances of skin sensation: Secondary | ICD-10-CM

## 2022-05-15 DIAGNOSIS — N644 Mastodynia: Secondary | ICD-10-CM

## 2022-06-05 ENCOUNTER — Ambulatory Visit: Payer: Managed Care, Other (non HMO)

## 2022-06-05 ENCOUNTER — Ambulatory Visit
Admission: RE | Admit: 2022-06-05 | Discharge: 2022-06-05 | Disposition: A | Discharge status: Home / Self Care | Payer: Managed Care, Other (non HMO) | Source: Ambulatory Visit | Attending: Nurse Practitioner | Admitting: Nurse Practitioner

## 2022-06-05 DIAGNOSIS — R208 Other disturbances of skin sensation: Secondary | ICD-10-CM

## 2022-06-05 DIAGNOSIS — N644 Mastodynia: Secondary | ICD-10-CM

## 2022-06-06 ENCOUNTER — Ambulatory Visit
Admission: RE | Admit: 2022-06-06 | Discharge: 2022-06-06 | Disposition: A | Discharge status: Home / Self Care | Payer: Managed Care, Other (non HMO) | Source: Ambulatory Visit | Attending: Emergency Medicine | Admitting: Emergency Medicine

## 2022-06-06 VITALS — BP 134/89 | HR 89 | Temp 98.1°F | Resp 20

## 2022-06-06 DIAGNOSIS — M7918 Myalgia, other site: Secondary | ICD-10-CM

## 2022-06-06 DIAGNOSIS — M2550 Pain in unspecified joint: Secondary | ICD-10-CM | POA: Diagnosis not present

## 2022-06-06 NOTE — Discharge Instructions (Addendum)
The blood work is pending.   Follow up with your primary care provider next week.    Go to the emergency department if you have persistent or worsening symptoms.

## 2022-06-06 NOTE — ED Provider Notes (Signed)
Latasha Klein    CSN: [id number] Arrival date & time: 06/06/22  1306      History   Chief Complaint Chief Complaint  Patient presents with   Leg Pain    HPI Latasha Klein is a 46 y.o. female.  Patient presents with pain in her arms and legs x3 days.  No falls or injury.  The pains are intermittent with no known aggravating or alleviating factors.  She also reports intermittent numbness in her arms and legs.  She also has noted "bumps" in her arms and legs under the skin.  No fever, chills, redness, bruising, chest pain, shortness of breath, weakness, or other symptoms.  Patient states she has been working a lot; she works 2 jobs.  She also moved last weekend.  No OTC medications taken today.  Her medical history includes abnormal uterine bleeding, GERD, headaches, anemia, excessive sweating.  The history is provided by the patient and medical records.    Past Medical History:  Diagnosis Date   Abnormal uterine bleeding (AUB)    Anemia    Blood dyscrasia    beta thalacemia   Chicken pox    GERD (gastroesophageal reflux disease)    Headache    occasional migraines    Patient Active Problem List   Diagnosis Date Noted   Excessive sweating 02/12/2021   GERD (gastroesophageal reflux disease) 02/13/2015    Past Surgical History:  Procedure Laterality Date   ABDOMINAL HYSTERECTOMY N/A 07/30/2015   Procedure: HYSTERECTOMY ABDOMINAL;  Surgeon: Linda Hedges, DO;  Location: [facility];  Service: Gynecology;  Laterality: N/A;   BILATERAL SALPINGECTOMY Bilateral 07/30/2015   Procedure: BILATERAL SALPINGECTOMY;  Surgeon: Linda Hedges, DO;  Location: [facility];  Service: Gynecology;  Laterality: Bilateral;   LAPAROSCOPIC ASSISTED VAGINAL HYSTERECTOMY N/A 07/30/2015   Procedure: ATTEMPTED LAPAROSCOPIC ASSISTED VAGINAL HYSTERECTOMY;  Surgeon: Linda Hedges, DO;  Location: [facility];  Service: Gynecology;  Laterality: N/A;   WISDOM TOOTH EXTRACTION      OB History   No obstetric history  on file.      Home Medications    Prior to Admission medications   Medication Sig Start Date End Date Taking? Authorizing Provider  calcium carbonate (TUMS - DOSED IN MG ELEMENTAL CALCIUM) 500 MG chewable tablet Chew 1 tablet by mouth daily as needed for heartburn.     [provider]  Cholecalciferol (VITAMIN D) 2000 units CAPS Take 1 capsule by mouth daily.    [provider]  desloratadine (CLARINEX) 5 MG tablet Take 5 mg by mouth daily. 03/06/21   [provider]  glycopyrrolate (ROBINUL) 1 MG tablet 1 tablet    [provider]  ipratropium (ATROVENT) 0.06 % nasal spray Place 2 sprays into both nostrils 4 (four) times daily. 07/24/21   Margarette Canada, NP  lidocaine (XYLOCAINE) 2 % solution Use as directed 15 mLs in the mouth or throat as needed for mouth pain. 07/24/21   Margarette Canada, NP  omeprazole (PRILOSEC) 20 MG capsule TAKE 1 CAPSULE (20 MG TOTAL) BY MOUTH DAILY. 01/21/21   Loura Halt A, NP  fluticasone (FLONASE) 50 MCG/ACT nasal spray Place 2 sprays into both nostrils daily. 02/02/18 01/21/21  Jearld Fenton, NP    Family History Family History  Problem Relation Age of Onset   Arthritis Mother    Arthritis Maternal Aunt    Cancer Maternal Aunt        Breast   Arthritis Maternal Uncle    Arthritis Maternal Grandmother  Stroke Maternal Grandmother    Cancer Paternal Grandfather        Prostate    Social History Social History   Tobacco Use   Smoking status: Never   Smokeless tobacco: Never  Vaping Use   Vaping Use: Never used  Substance Use Topics   Alcohol use: Yes    Alcohol/week: 0.0 standard drinks of alcohol    Comment: occasional   Drug use: No     Allergies   Amoxicillin and Latex   Review of Systems Review of Systems  Constitutional:  Negative for chills and fever.  Respiratory:  Negative for cough and shortness of breath.   Cardiovascular:  Negative for chest pain and palpitations.  Musculoskeletal:  Positive  for arthralgias and myalgias. Negative for gait problem.  Skin:  Negative for color change, rash and wound.  Neurological:  Positive for numbness. Negative for weakness.  All other systems reviewed and are negative.    Physical Exam Triage Vital Signs ED Triage Vitals  Enc Vitals Group     BP      Pulse      Resp      Temp      Temp src      SpO2      Weight      Height      Head Circumference      Peak Flow      Pain Score      Pain Loc      Pain Edu?      Excl. in [place]?    No data found.  Updated Vital Signs BP 134/89   Pulse 89   Temp 98.1 F (36.7 C)   Resp 20   LMP 07/13/2015 (Approximate)   SpO2 98%   Visual Acuity Right Eye Distance:   Left Eye Distance:   Bilateral Distance:    Right Eye Near:   Left Eye Near:    Bilateral Near:     Physical Exam Vitals and nursing note reviewed.  Constitutional:      General: She is not in acute distress.    Appearance: She is well-developed. She is obese. She is not ill-appearing.  HENT:     Mouth/Throat:     Mouth: Mucous membranes are moist.  Cardiovascular:     Rate and Rhythm: Normal rate and regular rhythm.     Heart sounds: Normal heart sounds.  Pulmonary:     Effort: Pulmonary effort is normal. No respiratory distress.     Breath sounds: Normal breath sounds.  Musculoskeletal:        General: No swelling, tenderness, deformity or signs of injury. Normal range of motion.     Cervical back: Neck supple.     Comments: No focal weakness or tenderness.   Skin:    General: Skin is warm and dry.     Capillary Refill: Capillary refill takes less than 2 seconds.     Findings: No bruising, erythema, lesion or rash.     Comments: Few small flesh-colored lipomas palpated on arms and legs.  No erythema, ecchymosis, wounds.   Neurological:     General: No focal deficit present.     Mental Status: She is alert and oriented to person, place, and time.     Sensory: No sensory deficit.     Motor: No weakness.      Gait: Gait normal.  Psychiatric:        Mood and Affect: Mood normal.  Behavior: Behavior normal.      UC Treatments / Results  Labs (all labs ordered are listed, but only abnormal results are displayed) Labs Reviewed  CBC  BASIC METABOLIC PANEL    EKG   Radiology MM DIAG BREAST TOMO BILATERAL  Result Date: 06/05/2022 CLINICAL DATA:  Bilateral intermittent breast pain with burning sensations in the upper outer quadrants of both breasts for the past month. The pain is nonfocal. EXAM: DIGITAL DIAGNOSTIC BILATERAL MAMMOGRAM WITH TOMOSYNTHESIS AND CAD TECHNIQUE: Bilateral digital diagnostic mammography and breast tomosynthesis was performed. The images were evaluated with computer-aided detection. COMPARISON:  Previous exam(s). ACR Breast Density Category b: There are scattered areas of fibroglandular density. FINDINGS: Stable mammographic appearance of the breasts with no findings suspicious for malignancy in either breast. IMPRESSION: No evidence of malignancy. RECOMMENDATION: Bilateral screening mammogram in 1 year. I have discussed the findings and recommendations with the patient. If applicable, a reminder letter will be sent to the patient regarding the next appointment. BI-RADS CATEGORY  1: Negative. Electronically Signed   By: Claudie Revering M.D.   On: 06/05/2022 14:19    Procedures Procedures (including critical care time)  Medications Ordered in UC Medications - No data to display  Initial Impression / Assessment and Plan / UC Course  I have reviewed the triage vital signs and the nursing notes.  Pertinent labs & imaging results that were available during my care of the patient were reviewed by me and considered in my medical decision making (see chart for details).    Myalgias and arthralgias of both arms and both legs.  Afebrile, vital signs are stable.  Patient is well-appearing and her exam is reassuring.  Per patient's request, CBC and BMP pending.  Instructed  patient to follow-up with her PCP next week.  ED precautions discussed.  Symptomatic care discussed including Tylenol or ibuprofen, rest.  She agrees to plan of care.  Final Clinical Impressions(s) / UC Diagnoses   Final diagnoses:  Myalgia, multiple sites  Arthralgia of multiple sites     Discharge Instructions      The blood work is pending.   Follow up with your primary care provider next week.    Go to the emergency department if you have persistent or worsening symptoms.          ED Prescriptions   None    PDMP not reviewed this encounter.   Sharion Balloon, NP 06/06/22 1346

## 2022-06-06 NOTE — ED Triage Notes (Signed)
Pt here with bilateral leg swelling, some tender bumps around knees, numbness and intermittent "sleepiness" in both legs along with some arm pain x 3 days.

## 2022-06-07 LAB — CBC
Hematocrit: 36.1 % (ref 34.0–46.6)
Hemoglobin: 11.4 g/dL (ref 11.1–15.9)
MCH: 21.9 pg — ABNORMAL LOW (ref 26.6–33.0)
MCHC: 31.6 g/dL (ref 31.5–35.7)
MCV: 69 fL — ABNORMAL LOW (ref 79–97)
Platelets: 367 10*3/uL (ref 150–450)
RBC: 5.2 x10E6/uL (ref 3.77–5.28)
RDW: 16.4 % — ABNORMAL HIGH (ref 11.7–15.4)
WBC: 5.6 10*3/uL (ref 3.4–10.8)

## 2022-06-07 LAB — BASIC METABOLIC PANEL
BUN/Creatinine Ratio: 17 (ref 9–23)
BUN: 12 mg/dL (ref 6–24)
CO2: 22 mmol/L (ref 20–29)
Calcium: 8.9 mg/dL (ref 8.7–10.2)
Chloride: 103 mmol/L (ref 96–106)
Creatinine, Ser: 0.69 mg/dL (ref 0.57–1.00)
Glucose: 79 mg/dL (ref 70–99)
Potassium: 4.3 mmol/L (ref 3.5–5.2)
Sodium: 138 mmol/L (ref 134–144)
eGFR: 108 mL/min/{1.73_m2} (ref >59)

## 2022-06-13 ENCOUNTER — Ambulatory Visit
Admission: EM | Admit: 2022-06-13 | Discharge: 2022-06-13 | Disposition: A | Discharge status: Home / Self Care | Payer: Managed Care, Other (non HMO) | Attending: Physician Assistant | Admitting: Physician Assistant

## 2022-06-13 ENCOUNTER — Encounter: Payer: Self-pay | Admitting: Emergency Medicine

## 2022-06-13 DIAGNOSIS — M62838 Other muscle spasm: Secondary | ICD-10-CM

## 2022-06-13 DIAGNOSIS — M545 Low back pain, unspecified: Secondary | ICD-10-CM | POA: Diagnosis not present

## 2022-06-13 MED ORDER — BACLOFEN 10 MG PO TABS: 10.0000 mg | ORAL_TABLET | Freq: Three times a day (TID) | ORAL | 0 refills | Status: AC

## 2022-06-13 NOTE — ED Triage Notes (Signed)
Patient states that she was seen a week ago at the Urgent Care in [facility] for lumps under her skin and swelling in her feet.  Patient states that she was told to elevate her feet.  Patient is here today because her symptoms have not improved.  Patient c/o lower back that started 3-4 days ago.  Patient states that she had been traveling recently.

## 2022-06-13 NOTE — Discharge Instructions (Addendum)
-  I reviewed your labs from last week and they were normal.  This is very reassuring that your electrolytes are normal. - I believe all of the firm areas that you are feeling under your skin are muscle cramps and spasms.  This makes sense since you have increased/changed just your workout routine.  Should also make sure you are stretching before and after. - I have sent a muscle relaxer to the pharmacy.  Continue the anti-inflammatory medicine and Tylenol. - Apply heat to the areas of firmness and discomfort.  Also try muscle rubs. -Drink more water. - Make a follow-up appointment with your doctor.  BACK PAIN: Stressed avoiding painful activities . RICE (REST, ICE, COMPRESSION, ELEVATION) guidelines reviewed. May alternate ice and heat. Consider use of muscle rubs, Salonpas patches, etc. Use medications as directed including muscle relaxers if prescribed. Take anti-inflammatory medications as prescribed or OTC NSAIDs/Tylenol.  F/u with PCP in 7-10 days for reexamination, and please feel free to call or return to the urgent care at any time for any questions or concerns you may have and we will be happy to help you!   BACK PAIN RED FLAGS: If the back pain acutely worsens or there are any red flag symptoms such as numbness/tingling, leg weakness, saddle anesthesia, or loss of bowel/bladder control, go immediately to the ER. Follow up with [place] as scheduled or sooner if the pain does not begin to resolve or if it worsens before the follow up

## 2022-06-13 NOTE — ED Provider Notes (Signed)
[facility]    CSN: [id number] Arrival date & time: 06/13/22  1740      History   Chief Complaint Chief Complaint  Patient presents with   Foot Swelling    bilateral   Back Pain    HPI Latasha Klein is a 46 y.o. female presenting for firm and swollen areas under skin of arms, legs and abdomen.  This is started over the past week or 2.  Patient does report going to a different urgent care a week ago and having labs done which were normal.  She was told the areas of swelling could be lipomas.  She was advised to elevate her feet to help with swelling and take anti-inflammatory medicine needed.  Patient reports her foot swelling has improved since elevating her feet.  She also reports a similar form and swollen areas have gone down but she has new ones now.  She also reports over the past 3 to 4 days she started to experience lower back pain bilaterally.  It does not radiate to lower extremities.  Not associated numbness, weakness or tingling.  Patient says the firm and swollen areas are tender to touch.  She reports that the ones that have gone away have left a bruise on her skin.  Patient does report that she started working out with [employer] and did not previously work out.  She has recently lost 15 pounds intentionally.  She has no other complaints.  HPI  Past Medical History:  Diagnosis Date   Abnormal uterine bleeding (AUB)    Anemia    Blood dyscrasia    beta thalacemia   Chicken pox    GERD (gastroesophageal reflux disease)    Headache    occasional migraines    Patient Active Problem List   Diagnosis Date Noted   Excessive sweating 02/12/2021   GERD (gastroesophageal reflux disease) 02/13/2015    Past Surgical History:  Procedure Laterality Date   ABDOMINAL HYSTERECTOMY N/A 07/30/2015   Procedure: HYSTERECTOMY ABDOMINAL;  Surgeon: Linda Hedges, DO;  Location: [facility];  Service: Gynecology;  Laterality: N/A;   BILATERAL SALPINGECTOMY Bilateral  07/30/2015   Procedure: BILATERAL SALPINGECTOMY;  Surgeon: Linda Hedges, DO;  Location: [facility];  Service: Gynecology;  Laterality: Bilateral;   LAPAROSCOPIC ASSISTED VAGINAL HYSTERECTOMY N/A 07/30/2015   Procedure: ATTEMPTED LAPAROSCOPIC ASSISTED VAGINAL HYSTERECTOMY;  Surgeon: Linda Hedges, DO;  Location: [facility];  Service: Gynecology;  Laterality: N/A;   WISDOM TOOTH EXTRACTION      OB History   No obstetric history on file.      Home Medications    Prior to Admission medications   Medication Sig Start Date End Date Taking? Authorizing Provider  baclofen (LIORESAL) 10 MG tablet Take 1 tablet (10 mg total) by mouth 3 (three) times daily for 7 days. 06/13/22 06/20/22 Yes Laurene Footman B, PA-C  calcium carbonate (TUMS - DOSED IN MG ELEMENTAL CALCIUM) 500 MG chewable tablet Chew 1 tablet by mouth daily as needed for heartburn.     [provider]  Cholecalciferol (VITAMIN D) 2000 units CAPS Take 1 capsule by mouth daily.    [provider]  desloratadine (CLARINEX) 5 MG tablet Take 5 mg by mouth daily. 03/06/21   [provider]  glycopyrrolate (ROBINUL) 1 MG tablet 1 tablet    [provider]  ipratropium (ATROVENT) 0.06 % nasal spray Place 2 sprays into both nostrils 4 (four) times daily. 07/24/21   Margarette Canada, NP  lidocaine (XYLOCAINE) 2 %  solution Use as directed 15 mLs in the mouth or throat as needed for mouth pain. 07/24/21   Margarette Canada, NP  omeprazole (PRILOSEC) 20 MG capsule TAKE 1 CAPSULE (20 MG TOTAL) BY MOUTH DAILY. 01/21/21   Loura Halt A, NP  fluticasone (FLONASE) 50 MCG/ACT nasal spray Place 2 sprays into both nostrils daily. 02/02/18 01/21/21  Jearld Fenton, NP    Family History Family History  Problem Relation Age of Onset   Arthritis Mother    Arthritis Maternal Aunt    Cancer Maternal Aunt        Breast   Arthritis Maternal Uncle    Arthritis Maternal Grandmother    Stroke Maternal Grandmother    Cancer Paternal Grandfather         Prostate    Social History Social History   Tobacco Use   Smoking status: Never   Smokeless tobacco: Never  Vaping Use   Vaping Use: Never used  Substance Use Topics   Alcohol use: Yes    Alcohol/week: 0.0 standard drinks of alcohol    Comment: occasional   Drug use: No     Allergies   Amoxicillin and Latex   Review of Systems Review of Systems  Constitutional:  Negative for fatigue and fever.  Respiratory:  Negative for shortness of breath.   Cardiovascular:  Negative for chest pain.  Musculoskeletal:  Positive for back pain and myalgias. Negative for gait problem.  Skin:  Positive for color change (bruises).  Neurological:  Negative for weakness and numbness.     Physical Exam Triage Vital Signs ED Triage Vitals  Enc Vitals Group     BP 06/13/22 1756 (!) 146/87     Pulse Rate 06/13/22 1756 (!) 114     Resp 06/13/22 1756 14     Temp 06/13/22 1756 98.4 F (36.9 C)     Temp Source 06/13/22 1756 Oral     SpO2 06/13/22 1756 100 %     Weight 06/13/22 1754 217 lb (98.4 kg)     Height 06/13/22 1754 '5\' 6"'$  (1.676 m)     Head Circumference --      Peak Flow --      Pain Score 06/13/22 1754 3     Pain Loc --      Pain Edu? --      Excl. in [place]? --    No data found.  Updated Vital Signs BP (!) 146/87 (BP Location: Left Arm)   Pulse (!) 114   Temp 98.4 F (36.9 C) (Oral)   Resp 14   Ht '5\' 6"'$  (1.676 m)   Wt 217 lb (98.4 kg)   LMP 07/13/2015 (Approximate)   SpO2 100%   BMI 35.02 kg/m   Physical Exam Vitals and nursing note reviewed.  Constitutional:      General: She is not in acute distress.    Appearance: Normal appearance. She is not ill-appearing or toxic-appearing.  HENT:     Head: Normocephalic and atraumatic.     Nose: Nose normal.     Mouth/Throat:     Mouth: Mucous membranes are moist.     Pharynx: Oropharynx is clear.  Eyes:     General: No scleral icterus.       Right eye: No discharge.        Left eye: No discharge.      Conjunctiva/sclera: Conjunctivae normal.  Cardiovascular:     Rate and Rhythm: Regular rhythm. Tachycardia present.     Heart sounds:  Normal heart sounds.  Pulmonary:     Effort: Pulmonary effort is normal. No respiratory distress.     Breath sounds: Normal breath sounds.  Musculoskeletal:     Cervical back: Neck supple.     Lumbar back: Tenderness present. No bony tenderness. Decreased range of motion.       Back:     Comments: TTP bilateral lumbar paravertebral muscles.  Skin:    General: Skin is dry.     Comments: Multiple scattered areas of increased firmness under skin of arms and thighs. Mild TTP. 2 small bruises of left thigh (where she says she had firm lumps previously that have gone away)  Neurological:     General: No focal deficit present.     Mental Status: She is alert. Mental status is at baseline.     Motor: No weakness.     Gait: Gait normal.     Comments: Somewhat anxious especially when discussing her fears of her symptoms being due to a blood clot  Psychiatric:        Thought Content: Thought content normal.      UC Treatments / Results  Labs (all labs ordered are listed, but only abnormal results are displayed) Labs Reviewed - No data to display  EKG   Radiology No results found.  Procedures Procedures (including critical care time)  Medications Ordered in UC Medications - No data to display  Initial Impression / Assessment and Plan / UC Course  I have reviewed the triage vital signs and the nursing notes.  Pertinent labs & imaging results that were available during my care of the patient were reviewed by me and considered in my medical decision making (see chart for details).   1.  Low back pain: Patient is afebrile and overall well-appearing.  Reports that she has been traveling quite a bit and sitting a lot.  Reports also increased working out and states that she has been using a lot of kettle bells and not stretching.  On exam she has  tenderness palpation of bilateral paralumbar muscles.  Suspect lumbar strain and likely muscle spasms.  Reviewed RICE guidelines.  Reviewed taking naproxen and Tylenol.  We will also try baclofen, heat, topical muscle rubs.  Reviewed ED precautions.  2.  Firm lumps under skin/muscle spasms: Patient also presenting with concerns for firm lumps under skin of arms and legs for the past 1 to 2 weeks.  She did have normal CBC and BMP when she went to another urgent care through [facility] a week ago.  Suspect the firm lumps are likely muscle spasms relating to her increased workout activities and improper stretching.  I have reviewed using heat to these areas and muscle rubs.  We will also try the baclofen and have her continue NSAIDs.  Advised making an appointment with her PCP for follow-up especially if the symptoms or not improving.   Final Clinical Impressions(s) / UC Diagnoses   Final diagnoses:  Muscle spasm  Acute bilateral low back pain without sciatica     Discharge Instructions      -I reviewed your labs from last week and they were normal.  This is very reassuring that your electrolytes are normal. - I believe all of the firm areas that you are feeling under your skin are muscle cramps and spasms.  This makes sense since you have increased/changed just your workout routine.  Should also make sure you are stretching before and after. - I have sent a  muscle relaxer to the pharmacy.  Continue the anti-inflammatory medicine and Tylenol. - Apply heat to the areas of firmness and discomfort.  Also try muscle rubs. -Drink more water. - Make a follow-up appointment with your doctor.  BACK PAIN: Stressed avoiding painful activities . RICE (REST, ICE, COMPRESSION, ELEVATION) guidelines reviewed. May alternate ice and heat. Consider use of muscle rubs, Salonpas patches, etc. Use medications as directed including muscle relaxers if prescribed. Take anti-inflammatory medications as  prescribed or OTC NSAIDs/Tylenol.  F/u with PCP in 7-10 days for reexamination, and please feel free to call or return to the urgent care at any time for any questions or concerns you may have and we will be happy to help you!   BACK PAIN RED FLAGS: If the back pain acutely worsens or there are any red flag symptoms such as numbness/tingling, leg weakness, saddle anesthesia, or loss of bowel/bladder control, go immediately to the ER. Follow up with [place] as scheduled or sooner if the pain does not begin to resolve or if it worsens before the follow up       ED Prescriptions     Medication Sig Dispense Auth. Provider   baclofen (LIORESAL) 10 MG tablet Take 1 tablet (10 mg total) by mouth 3 (three) times daily for 7 days. 21 tablet Danton Clap, PA-C      PDMP not reviewed this encounter.   Danton Clap, PA-C 06/13/22 1902

## 2022-06-20 ENCOUNTER — Ambulatory Visit: Payer: Managed Care, Other (non HMO) | Admitting: Nurse Practitioner

## 2022-06-20 ENCOUNTER — Encounter: Payer: Self-pay | Admitting: Nurse Practitioner

## 2022-06-20 VITALS — BP 119/85 | HR 94 | Temp 96.4°F | Ht 65.5 in | Wt 220.2 lb

## 2022-06-20 DIAGNOSIS — M546 Pain in thoracic spine: Secondary | ICD-10-CM | POA: Diagnosis not present

## 2022-06-20 DIAGNOSIS — Z1211 Encounter for screening for malignant neoplasm of colon: Secondary | ICD-10-CM

## 2022-06-20 DIAGNOSIS — R229 Localized swelling, mass and lump, unspecified: Secondary | ICD-10-CM | POA: Insufficient documentation

## 2022-06-20 DIAGNOSIS — D563 Thalassemia minor: Secondary | ICD-10-CM | POA: Diagnosis not present

## 2022-06-20 DIAGNOSIS — R1033 Periumbilical pain: Secondary | ICD-10-CM | POA: Diagnosis not present

## 2022-06-20 NOTE — Progress Notes (Signed)
New Patient Office Visit  Subjective    Patient ID: Latasha Klein, female    DOB: 1976/01/11  Age: 46 y.o. MRN: [id number]  CC:  Chief Complaint  Patient presents with   Establish Care    Np. Est care. Pt c/o burning sensation that radiates from upper and lower back. Pt states     HPI Latasha Klein presents for new patient visit to establish care.  Introduced to Designer, jewellery role and practice setting.  All questions answered.  Discussed provider/patient relationship and expectations.  She states that 2 weeks ago, she noticed some lumps under her skin on both legs. They hurt and were sore to touch. She went to urgent care and was told they were lipomas. She went on vacation, and noticed that these bumps are now on her arms and stomach. She went back to urgent care and was told it might be muscle spasms. She was given a muscle relaxer, and that didn't help either. She states that the areas are diminishing, but still hurting and now noticing bruises. She also states that she started exercising more recently.   She is also having burning along her sides and near her breasts. She went to her GYN and had a mammogram which was normal. She states the burning sensation is now in her back which has been there for about a week. Denies recent injuries. Denies changes in skin on her back.   Depression and Anxiety Screen done:     06/20/2022   10:23 AM 02/11/2021    2:26 PM 05/03/2020    7:52 AM 06/16/2017    1:19 PM 02/13/2015   12:48 PM  Depression screen PHQ 2/9  Decreased Interest  0 0 0 0  Down, Depressed, Hopeless 0 0 0 0 0  PHQ - 2 Score 0 0 0 0 0  Altered sleeping 0      Tired, decreased energy 1      Change in appetite 1      Feeling bad or failure about yourself  0      Trouble concentrating 0      Moving slowly or fidgety/restless 0      Suicidal thoughts 0      PHQ-9 Score 2      Difficult doing work/chores Somewhat difficult      .    06/20/2022   10:23 AM  GAD 7 :  Generalized Anxiety Score  Nervous, Anxious, on Edge 2  Worry too much - different things 1  Trouble relaxing 0  Easily annoyed or irritable 1  Afraid - awful might happen 0  Anxiety Difficulty Somewhat difficult    Outpatient Encounter Medications as of 06/20/2022  Medication Sig   baclofen (LIORESAL) 10 MG tablet Take 1 tablet (10 mg total) by mouth 3 (three) times daily for 7 days.   calcium carbonate (TUMS - DOSED IN MG ELEMENTAL CALCIUM) 500 MG chewable tablet Chew 1 tablet by mouth daily as needed for heartburn.    Cholecalciferol (VITAMIN D) 2000 units CAPS Take 1 capsule by mouth daily.   desloratadine (CLARINEX) 5 MG tablet Take 5 mg by mouth daily.   glycopyrrolate (ROBINUL) 1 MG tablet 1 tablet (Patient not taking: Reported on 06/20/2022)   ipratropium (ATROVENT) 0.06 % nasal spray Place 2 sprays into both nostrils 4 (four) times daily.   omeprazole (PRILOSEC) 20 MG capsule TAKE 1 CAPSULE (20 MG TOTAL) BY MOUTH DAILY.   [DISCONTINUED] fluticasone (FLONASE) 50 MCG/ACT nasal spray  Place 2 sprays into both nostrils daily.   [DISCONTINUED] lidocaine (XYLOCAINE) 2 % solution Use as directed 15 mLs in the mouth or throat as needed for mouth pain.   No facility-administered encounter medications on file as of 06/20/2022.    Past Medical History:  Diagnosis Date   Abnormal uterine bleeding (AUB)    Allergy Unknown   Most adult life   Anemia    Beta thalassemia trait    Blood dyscrasia    beta thalacemia   Chicken pox    GERD (gastroesophageal reflux disease)    Headache    occasional migraines   Uterine fibroid     Past Surgical History:  Procedure Laterality Date   ABDOMINAL HYSTERECTOMY N/A 07/30/2015   Procedure: HYSTERECTOMY ABDOMINAL;  Surgeon: Linda Hedges, DO;  Location: [facility];  Service: Gynecology;  Laterality: N/A;   BILATERAL SALPINGECTOMY Bilateral 07/30/2015   Procedure: BILATERAL SALPINGECTOMY;  Surgeon: Linda Hedges, DO;  Location: [facility];  Service:  Gynecology;  Laterality: Bilateral;   LAPAROSCOPIC ASSISTED VAGINAL HYSTERECTOMY N/A 07/30/2015   Procedure: ATTEMPTED LAPAROSCOPIC ASSISTED VAGINAL HYSTERECTOMY;  Surgeon: Linda Hedges, DO;  Location: [facility];  Service: Gynecology;  Laterality: N/A;   WISDOM TOOTH EXTRACTION      Family History  Problem Relation Age of Onset   Arthritis Mother    Arthritis Maternal Aunt    Cancer Maternal Aunt        Breast   Arthritis Maternal Uncle    Arthritis Maternal Grandmother    Stroke Maternal Grandmother    Cancer Paternal Grandfather        Prostate    Social History   Socioeconomic History   Marital status: Married    Spouse name: Not on file   Number of children: Not on file   Years of education: Not on file   Highest education level: Not on file  Occupational History   Not on file  Tobacco Use   Smoking status: Never   Smokeless tobacco: Never  Vaping Use   Vaping Use: Never used  Substance and Sexual Activity   Alcohol use: Yes    Alcohol/week: 1.0 standard drink of alcohol    Types: 1 Glasses of wine per week    Comment: Occassional   Drug use: No   Sexual activity: Yes    Birth control/protection: None    Comment: N/A  Other Topics Concern   Not on file  Social History Narrative   Not on file   Social Determinants of Health   Financial Resource Strain: Not on file  Food Insecurity: Not on file  Transportation Needs: Not on file  Physical Activity: Not on file  Stress: Not on file  Social Connections: Not on file  Intimate Partner Violence: Not on file    Review of Systems  Constitutional: Negative.   HENT:  Positive for ear pain. Negative for congestion and sore throat.   Eyes: Negative.   Respiratory: Negative.    Cardiovascular: Negative.   Gastrointestinal: Negative.   Genitourinary: Negative.   Musculoskeletal:  Positive for back pain.  Skin:        Lumps under skin on arms, legs, and stomach  Neurological: Negative.   Psychiatric/Behavioral:   Negative for depression. The patient is nervous/anxious.      Objective    BP 119/85   Pulse 94   Temp (!) 96.4 F (35.8 C) (Temporal)   Ht 5' 5.5" (1.664 m)   Wt 220 lb 3.2 oz (99.9 kg)  LMP 07/13/2015 (Approximate)   SpO2 100%   BMI 36.09 kg/m   Physical Exam Vitals and nursing note reviewed.  Constitutional:      General: She is not in acute distress.    Appearance: Normal appearance.  HENT:     Head: Normocephalic.  Eyes:     Conjunctiva/sclera: Conjunctivae normal.  Cardiovascular:     Rate and Rhythm: Normal rate and regular rhythm.     Pulses: Normal pulses.     Heart sounds: Normal heart sounds.  Pulmonary:     Effort: Pulmonary effort is normal.     Breath sounds: Normal breath sounds.  Abdominal:     Palpations: Abdomen is soft.     Tenderness: There is abdominal tenderness (left and right of umilicus).  Musculoskeletal:        General: No tenderness. Normal range of motion.     Cervical back: Normal range of motion and neck supple. No tenderness.  Lymphadenopathy:     Cervical: No cervical adenopathy.  Skin:    General: Skin is warm.     Comments: Small pea sized lump palpated under skin on left lateral thigh. Varicose veins on bilateral legs  Neurological:     General: No focal deficit present.     Mental Status: She is alert and oriented to person, place, and time.  Psychiatric:        Mood and Affect: Mood normal.        Behavior: Behavior normal.        Thought Content: Thought content normal.        Judgment: Judgment normal.    Last CBC Lab Results  Component Value Date   WBC 5.6 06/06/2022   HGB 11.4 06/06/2022   HCT 36.1 06/06/2022   MCV 69 (L) 06/06/2022   MCH 21.9 (L) 06/06/2022   RDW 16.4 (H) 06/06/2022   PLT 367 26/37/8588   Last metabolic panel Lab Results  Component Value Date   GLUCOSE 79 06/06/2022   NA 138 06/06/2022   K 4.3 06/06/2022   CL 103 06/06/2022   CO2 22 06/06/2022   BUN 12 06/06/2022   CREATININE 0.69  06/06/2022   EGFR 108 06/06/2022   CALCIUM 8.9 06/06/2022   PROT 7.3 02/11/2021   ALBUMIN 4.3 02/11/2021   LABGLOB 3.0 02/11/2021   AGRATIO 1.4 02/11/2021   BILITOT 0.3 02/11/2021   ALKPHOS 50 02/11/2021   AST 12 02/11/2021   ALT 9 02/11/2021   ANIONGAP 8 02/02/2021        Assessment & Plan:   Problem List Items Addressed This Visit       Other   Lump of skin - Primary    For the past several weeks she has noticed some painful lumps under her skin on her arms, stomach, and legs.  They are going away and today only 1 can be palpated on her left lateral thigh that is pea-sized.  We will order an ultrasound for further evaluation.  Check CMP, ANA.  Recent CBC was normal.      Relevant Orders   [place] LT LOWER EXTREM LTD SOFT TISSUE NON VASCULAR   ANA w/Reflex   Comp Met (CMET)   Periumbilical abdominal pain    She has been having pain in her abdomen on bilateral sides of her umbilicus.  She has started exercising more recently and using kettle bells.  We will check an ultrasound of her abdomen and rule out hernia.  With increasing exercise, could be musculoskeletal.  Relevant Orders   US Abdomen Complete   ANA w/Reflex   Comp Met (CMET)   Acute midline thoracic back pain    She has been having a burning sensation to her back.  We will check a vitamin B12 level today.  Encouraged her to use ice or heat and to make sure that she is stretching when she is exercising.  Follow-up in 4 weeks.      Relevant Orders   Vitamin B12   Beta thalassemia trait    She has a history of beta thalassemia trait.  She states that her hemoglobin has increased since having her hysterectomy after the uterine fibroids.  Last CBC was stable.      Other Visit Diagnoses     Screen for colon cancer       Discussed various colon cancer options and she would like to do the cologuard. Order placed.    Relevant Orders   Cologuard       Return in about 4 weeks (around 07/18/2022).   Charyl Dancer, NP

## 2022-06-20 NOTE — Patient Instructions (Signed)
It was great to see you!  You will be called to schedule an ultrasound.   We are checking your labs today and will let you know the results via mychart/phone.   Keep taking the benadryl and using heat on your back.   Let's follow-up in 4 weeks, sooner if you have concerns.  If a referral was placed today, you will be contacted for an appointment. Please note that routine referrals can sometimes take up to 3-4 weeks to process. Please call our office if you haven't heard anything after this time frame.  Take care,  Vance Peper, NP

## 2022-06-20 NOTE — Assessment & Plan Note (Signed)
She has a history of beta thalassemia trait.  She states that her hemoglobin has increased since having her hysterectomy after the uterine fibroids.  Last CBC was stable.

## 2022-06-20 NOTE — Assessment & Plan Note (Signed)
She has been having pain in her abdomen on bilateral sides of her umbilicus.  She has started exercising more recently and using kettle bells.  We will check an ultrasound of her abdomen and rule out hernia.  With increasing exercise, could be musculoskeletal.

## 2022-06-20 NOTE — Assessment & Plan Note (Signed)
For the past several weeks she has noticed some painful lumps under her skin on her arms, stomach, and legs.  They are going away and today only 1 can be palpated on her left lateral thigh that is pea-sized.  We will order an ultrasound for further evaluation.  Check CMP, ANA.  Recent CBC was normal.

## 2022-06-20 NOTE — Assessment & Plan Note (Signed)
She has been having a burning sensation to her back.  We will check a vitamin B12 level today.  Encouraged her to use ice or heat and to make sure that she is stretching when she is exercising.  Follow-up in 4 weeks.

## 2022-06-21 LAB — COMPREHENSIVE METABOLIC PANEL
ALT: 11 IU/L (ref 0–32)
AST: 15 IU/L (ref 0–40)
Albumin/Globulin Ratio: 1.2 (ref 1.2–2.2)
Albumin: 4 g/dL (ref 3.9–4.9)
Alkaline Phosphatase: 66 IU/L (ref 44–121)
BUN/Creatinine Ratio: 21 (ref 9–23)
BUN: 14 mg/dL (ref 6–24)
Bilirubin Total: <0.2 mg/dL (ref 0.0–1.2)
CO2: 23 mmol/L (ref 20–29)
Calcium: 9.2 mg/dL (ref 8.7–10.2)
Chloride: 104 mmol/L (ref 96–106)
Creatinine, Ser: 0.67 mg/dL (ref 0.57–1.00)
Globulin, Total: 3.3 g/dL (ref 1.5–4.5)
Glucose: 85 mg/dL (ref 70–99)
Potassium: 4.3 mmol/L (ref 3.5–5.2)
Sodium: 140 mmol/L (ref 134–144)
Total Protein: 7.3 g/dL (ref 6.0–8.5)
eGFR: 108 mL/min/{1.73_m2} (ref >59)

## 2022-06-21 LAB — VITAMIN B12: Vitamin B-12: 422 pg/mL (ref 232–1245)

## 2022-06-21 LAB — ANA W/REFLEX: Anti Nuclear Antibody (ANA): NEGATIVE

## 2022-06-26 ENCOUNTER — Ambulatory Visit: Payer: Managed Care, Other (non HMO) | Admitting: Nurse Practitioner

## 2022-06-27 ENCOUNTER — Ambulatory Visit
Admission: RE | Admit: 2022-06-27 | Discharge: 2022-06-27 | Disposition: A | Discharge status: Home / Self Care | Payer: Managed Care, Other (non HMO) | Source: Ambulatory Visit | Attending: Nurse Practitioner | Admitting: Nurse Practitioner

## 2022-06-27 DIAGNOSIS — R1033 Periumbilical pain: Secondary | ICD-10-CM | POA: Insufficient documentation

## 2022-06-27 DIAGNOSIS — R229 Localized swelling, mass and lump, unspecified: Secondary | ICD-10-CM | POA: Diagnosis present

## 2022-07-12 LAB — COLOGUARD: COLOGUARD: NEGATIVE

## 2022-07-18 ENCOUNTER — Encounter: Payer: Self-pay | Admitting: Nurse Practitioner

## 2022-07-18 ENCOUNTER — Ambulatory Visit (INDEPENDENT_AMBULATORY_CARE_PROVIDER_SITE_OTHER): Payer: Managed Care, Other (non HMO) | Admitting: Nurse Practitioner

## 2022-07-18 VITALS — BP 123/86 | HR 83 | Temp 98.0°F | Wt 222.0 lb

## 2022-07-18 DIAGNOSIS — Z0000 Encounter for general adult medical examination without abnormal findings: Secondary | ICD-10-CM | POA: Diagnosis not present

## 2022-07-18 DIAGNOSIS — D1720 Benign lipomatous neoplasm of skin and subcutaneous tissue of unspecified limb: Secondary | ICD-10-CM | POA: Diagnosis not present

## 2022-07-18 DIAGNOSIS — Z23 Encounter for immunization: Secondary | ICD-10-CM | POA: Diagnosis not present

## 2022-07-18 DIAGNOSIS — E7800 Pure hypercholesterolemia, unspecified: Secondary | ICD-10-CM

## 2022-07-18 DIAGNOSIS — E669 Obesity, unspecified: Secondary | ICD-10-CM | POA: Diagnosis not present

## 2022-07-18 MED ORDER — OMEPRAZOLE 20 MG PO CPDR
DELAYED_RELEASE_CAPSULE | ORAL | 3 refills | Status: AC
Start: 2022-07-18 — End: ?

## 2022-07-18 NOTE — Assessment & Plan Note (Signed)
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 Reassured patient these are a benign finding. Can refer to general surgery if she would like. Declines at the moment.

## 2022-07-18 NOTE — Progress Notes (Signed)
an

## 2022-07-18 NOTE — Assessment & Plan Note (Signed)
Total cholesterol and LDL cholesterol were elevated last year. Will check lipid panel today.

## 2022-07-18 NOTE — Progress Notes (Signed)
BP 123/86   Pulse 83   Temp 98 F (36.7 C) (Oral)   Wt 222 lb (100.7 kg)   LMP 07/13/2015 (Approximate)   SpO2 100%   BMI 36.38 kg/m    Subjective:    Patient ID: Latasha Klein, female    DOB: February 25, 1976, 46 y.o.   MRN: [id number]  CC: Chief Complaint  Patient presents with   Annual Exam    CPE. Pt is fasting   HPI: Latasha Klein is a 46 y.o. female presenting on 07/18/2022 for comprehensive medical examination. Current medical complaints include:none  She currently lives with: husband Menopausal Symptoms: yes intermittent flushing  Depression Screen done today and results listed below:     07/18/2022    8:37 AM 06/20/2022   10:23 AM 02/11/2021    2:26 PM 05/03/2020    7:52 AM 06/16/2017    1:19 PM  Depression screen PHQ 2/9  Decreased Interest 0  0 0 0  Down, Depressed, Hopeless 0 0 0 0 0  PHQ - 2 Score 0 0 0 0 0  Altered sleeping  0     Tired, decreased energy  1     Change in appetite  1     Feeling bad or failure about yourself   0     Trouble concentrating  0     Moving slowly or fidgety/restless  0     Suicidal thoughts  0     PHQ-9 Score  2     Difficult doing work/chores  Somewhat difficult       The patient does not have a history of falls. I did not complete a risk assessment for falls. A plan of care for falls was not documented.   Past Medical History:  Past Medical History:  Diagnosis Date   Abnormal uterine bleeding (AUB)    Allergy Unknown   Most adult life   Anemia    Beta thalassemia trait    Blood dyscrasia    beta thalacemia   Chicken pox    GERD (gastroesophageal reflux disease)    Headache    occasional migraines   Uterine fibroid     Surgical History:  Past Surgical History:  Procedure Laterality Date   ABDOMINAL HYSTERECTOMY N/A 07/30/2015   Procedure: HYSTERECTOMY ABDOMINAL;  Surgeon: Linda Hedges, DO;  Location: [facility];  Service: Gynecology;  Laterality: N/A;   BILATERAL SALPINGECTOMY Bilateral 07/30/2015   Procedure:  BILATERAL SALPINGECTOMY;  Surgeon: Linda Hedges, DO;  Location: [facility];  Service: Gynecology;  Laterality: Bilateral;   LAPAROSCOPIC ASSISTED VAGINAL HYSTERECTOMY N/A 07/30/2015   Procedure: ATTEMPTED LAPAROSCOPIC ASSISTED VAGINAL HYSTERECTOMY;  Surgeon: Linda Hedges, DO;  Location: [facility];  Service: Gynecology;  Laterality: N/A;   WISDOM TOOTH EXTRACTION      Medications:  Current Outpatient Medications on File Prior to Visit  Medication Sig   calcium carbonate (TUMS - DOSED IN MG ELEMENTAL CALCIUM) 500 MG chewable tablet Chew 1 tablet by mouth daily as needed for heartburn.    Cholecalciferol (VITAMIN D) 2000 units CAPS Take 1 capsule by mouth daily.   desloratadine (CLARINEX) 5 MG tablet Take 5 mg by mouth daily.   glycopyrrolate (ROBINUL) 1 MG tablet 1 tablet (Patient not taking: Reported on 06/20/2022)   ipratropium (ATROVENT) 0.06 % nasal spray Place 2 sprays into both nostrils 4 (four) times daily.   [DISCONTINUED] fluticasone (FLONASE) 50 MCG/ACT nasal spray Place 2 sprays into both nostrils daily.   No current facility-administered medications on  file prior to visit.    Allergies:  Allergies  Allergen Reactions   Levonorgestrel-Ethinyl Estrad Nausea And Vomiting, Itching and Rash    Other reaction(s): cough, headache   Other Itching and Rash    Other reaction(s): Cough, Eye Redness, Headache   Amoxicillin    Latex Itching    Pt states itching with latex gloves.    Social History:   Social History   Tobacco Use  Smoking Status Never  Smokeless Tobacco Never   Social History   Substance and Sexual Activity  Alcohol Use Yes   Alcohol/week: 1.0 standard drink of alcohol   Types: 1 Glasses of wine per week   Comment: Occassional    Family History:  Family History  Problem Relation Age of Onset   Arthritis Mother    Arthritis Maternal Aunt    Cancer Maternal Aunt        Breast   Arthritis Maternal Uncle    Arthritis Maternal Grandmother    Stroke Maternal  Grandmother    Cancer Paternal Grandfather        Prostate    Past medical history, surgical history, medications, allergies, family history and social history reviewed with patient today and changes made to appropriate areas of the chart.   Review of Systems  Constitutional:  Positive for malaise/fatigue. Negative for fever.  HENT: Negative.    Eyes: Negative.   Respiratory: Negative.    Cardiovascular: Negative.   Gastrointestinal:  Positive for constipation. Negative for abdominal pain and diarrhea.  Genitourinary: Negative.   Musculoskeletal: Negative.   Skin: Negative.   Neurological:  Positive for dizziness (intermittent sometimes in the morning). Negative for headaches.  Endo/Heme/Allergies:  Positive for environmental allergies.  Psychiatric/Behavioral:  Negative for depression. The patient is nervous/anxious.    All other ROS negative except what is listed above and in the HPI.      Objective:    BP 123/86   Pulse 83   Temp 98 F (36.7 C) (Oral)   Wt 222 lb (100.7 kg)   LMP 07/13/2015 (Approximate)   SpO2 100%   BMI 36.38 kg/m   Wt Readings from Last 3 Encounters:  07/18/22 222 lb (100.7 kg)  06/20/22 220 lb 3.2 oz (99.9 kg)  06/13/22 217 lb (98.4 kg)    Physical Exam Vitals and nursing note reviewed.  Constitutional:      General: She is not in acute distress.    Appearance: Normal appearance.  HENT:     Head: Normocephalic and atraumatic.     Right Ear: Tympanic membrane, ear canal and external ear normal.     Left Ear: Tympanic membrane, ear canal and external ear normal.  Eyes:     Conjunctiva/sclera: Conjunctivae normal.  Cardiovascular:     Rate and Rhythm: Normal rate and regular rhythm.     Pulses: Normal pulses.     Heart sounds: Normal heart sounds.  Pulmonary:     Effort: Pulmonary effort is normal.     Breath sounds: Normal breath sounds.  Abdominal:     Palpations: Abdomen is soft.     Tenderness: There is abdominal tenderness (slight  RUQ).  Musculoskeletal:        General: Normal range of motion.     Cervical back: Normal range of motion and neck supple. No tenderness.     Right lower leg: No edema.     Left lower leg: No edema.  Lymphadenopathy:     Cervical: No cervical adenopathy.  Skin:  General: Skin is warm and dry.  Neurological:     General: No focal deficit present.     Mental Status: She is alert and oriented to person, place, and time.     Cranial Nerves: No cranial nerve deficit.     Coordination: Coordination normal.     Gait: Gait normal.  Psychiatric:        Mood and Affect: Mood normal.        Behavior: Behavior normal.        Thought Content: Thought content normal.        Judgment: Judgment normal.    Results for orders placed or performed in visit on 06/20/22  Cologuard  Result Value Ref Range   COLOGUARD Negative Negative  ANA w/Reflex  Result Value Ref Range   Anti Nuclear Antibody (ANA) Negative Negative  Comp Met (CMET)  Result Value Ref Range   Glucose 85 70 - 99 mg/dL   BUN 14 6 - 24 mg/dL   Creatinine, Ser 0.67 0.57 - 1.00 mg/dL   eGFR 108 >59 mL/min/1.73   BUN/Creatinine Ratio 21 9 - 23   Sodium 140 134 - 144 mmol/L   Potassium 4.3 3.5 - 5.2 mmol/L   Chloride 104 96 - 106 mmol/L   CO2 23 20 - 29 mmol/L   Calcium 9.2 8.7 - 10.2 mg/dL   Total Protein 7.3 6.0 - 8.5 g/dL   Albumin 4.0 3.9 - 4.9 g/dL   Globulin, Total 3.3 1.5 - 4.5 g/dL   Albumin/Globulin Ratio 1.2 1.2 - 2.2   Bilirubin Total <0.2 0.0 - 1.2 mg/dL   Alkaline Phosphatase 66 44 - 121 IU/L   AST 15 0 - 40 IU/L   ALT 11 0 - 32 IU/L  Vitamin B12  Result Value Ref Range   Vitamin B-12 422 232 - 1,245 pg/mL      Assessment & Plan:   Problem List Items Addressed This Visit       Other   Obesity (BMI 30-39.9)    BMI 36.3. She has been doing intermittent fasting and exercising at the gym several days per week. She has lost 15 pounds recently. Congratulated her on this! Keep up these new habits.        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 Reassured patient these are a benign finding. Can refer to general surgery if she would like. Declines at the moment.      Pure hypercholesterolemia    Total cholesterol and LDL cholesterol were elevated last year. Will check lipid panel today.       Relevant Orders   Lipid panel   Other Visit Diagnoses     Routine general medical examination at a health care facility    -  Primary   Health maintenance reviewed and updated. Td booster updated today. She is currently exercising and intermittent fasting. F/U 1 year   Need for Td vaccine       TD booster updated today.   Relevant Orders   Td vaccine greater than or equal to 7yo preservative free IM (Completed)        Follow up plan: Return in about 1 year (around 07/19/2023) for CPE.   LABORATORY TESTING:  - Pap smear: not applicable - hysterectomy  IMMUNIZATIONS:   - Tdap: Tetanus vaccination status reviewed: Td vaccination indicated and given today. - Influenza: Postponed to flu season - Pneumovax:  Not applicable - Prevnar: Not applicable - HPV: Not applicable - Zostavax vaccine: Not applicable  SCREENING: -Mammogram: Up to date  - Colonoscopy: Up to date  - Bone Density: Not applicable  -Hearing Test: Not applicable  -Spirometry: Not applicable   PATIENT COUNSELING:   Advised to take 1 mg of folate supplement per day if capable of pregnancy.   Sexuality: Discussed sexually transmitted diseases, partner selection, use of condoms, avoidance of unintended pregnancy  and contraceptive alternatives.   Advised to avoid cigarette smoking.  I discussed with the patient that most people either abstain from alcohol or drink within safe limits (<=14/week and <=4 drinks/occasion for males, <=7/weeks and <= 3 drinks/occasion for females) and that the risk for alcohol disorders and other health effects  rises proportionally with the number of drinks per week and how often a drinker exceeds daily limits.  Discussed cessation/primary prevention of drug use and availability of treatment for abuse.   Diet: Encouraged to adjust caloric intake to maintain  or achieve ideal body weight, to reduce intake of dietary saturated fat and total fat, to limit sodium intake by avoiding high sodium foods and not adding table salt, and to maintain adequate dietary potassium and calcium preferably from fresh fruits, vegetables, and low-fat dairy products.    stressed the importance of regular exercise  Injury prevention: Discussed safety belts, safety helmets, smoke detector, smoking near bedding or upholstery.   Dental health: Discussed importance of regular tooth brushing, flossing, and dental visits.    NEXT PREVENTATIVE PHYSICAL DUE IN 1 YEAR. Return in about 1 year (around 07/19/2023) for CPE.

## 2022-07-18 NOTE — Assessment & Plan Note (Signed)
BMI 36.3. She has been doing intermittent fasting and exercising at the gym several days per week. She has lost 15 pounds recently. Congratulated her on this! Keep up these new habits.

## 2022-07-18 NOTE — Patient Instructions (Signed)
It was great to see you!  We are checking your labs today and will let you know the results via mychart/phone.   Let's follow-up in 1 year, sooner if you have concerns.  If a referral was placed today, you will be contacted for an appointment. Please note that routine referrals can sometimes take up to 3-4 weeks to process. Please call our office if you haven't heard anything after this time frame.  Take care,  Jairus Tonne, NP  

## 2022-07-19 LAB — LIPID PANEL
Chol/HDL Ratio: 2.4 ratio (ref 0.0–4.4)
Cholesterol, Total: 171 mg/dL (ref 100–199)
HDL: 70 mg/dL (ref >39)
LDL Chol Calc (NIH): 92 mg/dL (ref 0–99)
Triglycerides: 45 mg/dL (ref 0–149)
VLDL Cholesterol Cal: 9 mg/dL (ref 5–40)

## 2022-07-19 LAB — HEMOGLOBIN A1C
Est. average glucose Bld gHb Est-mCnc: 108 mg/dL
Hgb A1c MFr Bld: 5.4 % (ref 4.8–5.6)
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
## 2022-12-24 ENCOUNTER — Encounter: Payer: Self-pay | Admitting: Nurse Practitioner

## 2023-03-13 ENCOUNTER — Telehealth: Payer: Self-pay | Admitting: Family Medicine

## 2023-03-13 DIAGNOSIS — J301 Allergic rhinitis due to pollen: Secondary | ICD-10-CM

## 2023-03-13 MED ORDER — PREDNISONE 20 MG PO TABS: 20.0000 mg | ORAL_TABLET | Freq: Two times a day (BID) | ORAL | 0 refills | Status: AC

## 2023-03-13 NOTE — Patient Instructions (Signed)

## 2023-03-13 NOTE — Progress Notes (Signed)
Virtual Visit Consent   Latasha Klein, you are scheduled for a virtual visit with a [facility] provider today. Just as with appointments in the office, your consent must be obtained to participate. Your consent will be active for this visit and any virtual visit you may have with one of our providers in the next 365 days. If you have a MyChart account, a copy of this consent can be sent to you electronically.  As this is a virtual visit, video technology does not allow for your provider to perform a traditional examination. This may limit your provider's ability to fully assess your condition. If your provider identifies any concerns that need to be evaluated in person or the need to arrange testing (such as labs, EKG, etc.), we will make arrangements to do so. Although advances in technology are sophisticated, we cannot ensure that it will always work on either your end or our end. If the connection with a video visit is poor, the visit may have to be switched to a telephone visit. With either a video or telephone visit, we are not always able to ensure that we have a secure connection.  By engaging in this virtual visit, you consent to the provision of healthcare and authorize for your insurance to be billed (if applicable) for the services provided during this visit. Depending on your insurance coverage, you may receive a charge related to this service.  I need to obtain your verbal consent now. Are you willing to proceed with your visit today? Latasha Klein has provided verbal consent on 03/13/2023 for a virtual visit (video or telephone). Georgana Curio, FNP  Date: 03/13/2023 12:28 PM  Virtual Visit via Video Note   I, Georgana Curio, connected with  Latasha Klein  ([id number], 1976-05-05) on 03/13/23 at 12:30 PM EDT by a video-enabled telemedicine application and verified that I am speaking with the correct person using two identifiers.  Location: Patient: Virtual Visit Location Patient:  Home Provider: Virtual Visit Location Provider: Home Office   I discussed the limitations of evaluation and management by telemedicine and the availability of in person appointments. The patient expressed understanding and agreed to proceed.    History of Present Illness: Latasha Klein is a 47 y.o. who identifies as a female who was assigned female at birth, and is being seen today for allergy sx with nasal congestion, sneezing, watery eyes. Took benadryl. Says she doesn't feel bad. No fever. Marland Kitchen  HPI: HPI  Problems:  Patient Active Problem List   Diagnosis Date Noted   Lipoma of lower extremity 07/18/2022   Pure hypercholesterolemia 07/18/2022   Periumbilical abdominal pain 06/20/2022   Acute midline thoracic back pain 06/20/2022   Beta thalassemia trait 06/20/2022   Excessive sweating 02/12/2021   Obesity (BMI 30-39.9) 02/13/2015   GERD (gastroesophageal reflux disease) 02/13/2015    Allergies:  Allergies  Allergen Reactions   Levonorgestrel-Ethinyl Estrad Nausea And Vomiting, Itching and Rash    Other reaction(s): cough, headache   Other Itching and Rash    Other reaction(s): Cough, Eye Redness, Headache   Amoxicillin    Latex Itching    Pt states itching with latex gloves.   Medications:  Current Outpatient Medications:    calcium carbonate (TUMS - DOSED IN MG ELEMENTAL CALCIUM) 500 MG chewable tablet, Chew 1 tablet by mouth daily as needed for heartburn. , Disp: , Rfl:    Cholecalciferol (VITAMIN D) 2000 units CAPS, Take 1 capsule by mouth daily., Disp: ,  Rfl:    desloratadine (CLARINEX) 5 MG tablet, Take 5 mg by mouth daily., Disp: , Rfl:    glycopyrrolate (ROBINUL) 1 MG tablet, 1 tablet (Patient not taking: Reported on 06/20/2022), Disp: , Rfl:    ipratropium (ATROVENT) 0.06 % nasal spray, Place 2 sprays into both nostrils 4 (four) times daily., Disp: 15 mL, Rfl: 12   omeprazole (PRILOSEC) 20 MG capsule, TAKE 1 CAPSULE (20 MG TOTAL) BY MOUTH DAILY., Disp: 90 capsule, Rfl:  3  Observations/Objective: Patient is well-developed, well-nourished in no acute distress.  Resting comfortably  at home.  Head is normocephalic, atraumatic.  No labored breathing.  Speech is clear and coherent with logical content.  Patient is alert and oriented at baseline.    Assessment and Plan: 1. Seasonal allergic rhinitis due to pollen  Increase fluids, start allegra and flonase through may. UC if sx worsen.   Follow Up Instructions: I discussed the assessment and treatment plan with the patient. The patient was provided an opportunity to ask questions and all were answered. The patient agreed with the plan and demonstrated an understanding of the instructions.  A copy of instructions were sent to the patient via MyChart unless otherwise noted below.     The patient was advised to call back or seek an in-person evaluation if the symptoms worsen or if the condition fails to improve as anticipated.  Time:  I spent 10 minutes with the patient via telehealth technology discussing the above problems/concerns.    Georgana Curioiane Hajer Dwyer, FNP

## 2023-12-29 ENCOUNTER — Ambulatory Visit (LOCAL_COMMUNITY_HEALTH_CENTER): Payer: Self-pay

## 2023-12-29 DIAGNOSIS — Z111 Encounter for screening for respiratory tuberculosis: Secondary | ICD-10-CM

## 2024-01-01 ENCOUNTER — Ambulatory Visit: Payer: Self-pay

## 2024-01-01 DIAGNOSIS — Z111 Encounter for screening for respiratory tuberculosis: Secondary | ICD-10-CM

## 2024-01-01 LAB — TB SKIN TEST
Induration: 0 mm
TB Skin Test: NEGATIVE

## 2024-03-07 ENCOUNTER — Encounter: Payer: Self-pay | Admitting: Nurse Practitioner

## 2024-08-15 ENCOUNTER — Encounter: Payer: Self-pay | Admitting: Nurse Practitioner
